# Patient Record
Sex: Female | Born: 1985 | Race: Black or African American | Hispanic: No | Marital: Single | Smoking: Current every day smoker
Health system: Southern US, Community
[De-identification: ages and names within clinical notes are randomized; demographics above are authoritative.]

## PROBLEM LIST (undated history)

## (undated) DIAGNOSIS — D649 Anemia, unspecified: Secondary | ICD-10-CM

## (undated) DIAGNOSIS — T7840XA Allergy, unspecified, initial encounter: Secondary | ICD-10-CM

## (undated) DIAGNOSIS — K5090 Crohn's disease, unspecified, without complications: Secondary | ICD-10-CM

## (undated) DIAGNOSIS — K529 Noninfective gastroenteritis and colitis, unspecified: Secondary | ICD-10-CM

## (undated) DIAGNOSIS — L409 Psoriasis, unspecified: Secondary | ICD-10-CM

## (undated) DIAGNOSIS — N189 Chronic kidney disease, unspecified: Secondary | ICD-10-CM

## (undated) DIAGNOSIS — F4321 Adjustment disorder with depressed mood: Secondary | ICD-10-CM

## (undated) DIAGNOSIS — E440 Moderate protein-calorie malnutrition: Secondary | ICD-10-CM

## (undated) HISTORY — DX: Adjustment disorder with depressed mood: F43.21

## (undated) HISTORY — DX: Noninfective gastroenteritis and colitis, unspecified: K52.9

## (undated) HISTORY — PX: HERNIA REPAIR: SHX51

## (undated) HISTORY — DX: Anemia, unspecified: D64.9

## (undated) HISTORY — DX: Psoriasis, unspecified: L40.9

## (undated) HISTORY — DX: Allergy, unspecified, initial encounter: T78.40XA

## (undated) HISTORY — DX: Crohn's disease, unspecified, without complications: K50.90

## (undated) HISTORY — PX: TONSILLECTOMY: SHX5217

## (undated) HISTORY — DX: Moderate protein-calorie malnutrition: E44.0

## (undated) HISTORY — DX: Chronic kidney disease, unspecified: N18.9

---
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
## 2003-07-28 ENCOUNTER — Emergency Department (HOSPITAL_COMMUNITY): Admission: AD | Admit: 2003-07-28 | Discharge: 2003-07-28 | Payer: Self-pay | Admitting: Family Medicine

## 2005-11-19 ENCOUNTER — Other Ambulatory Visit: Admission: RE | Admit: 2005-11-19 | Discharge: 2005-11-19 | Payer: Self-pay | Admitting: *Deleted

## 2007-09-28 ENCOUNTER — Other Ambulatory Visit: Admission: RE | Admit: 2007-09-28 | Discharge: 2007-09-28 | Payer: Self-pay | Admitting: Gynecology

## 2008-03-19 ENCOUNTER — Other Ambulatory Visit: Admission: RE | Admit: 2008-03-19 | Discharge: 2008-03-19 | Payer: Self-pay | Admitting: Gynecology

## 2009-02-05 ENCOUNTER — Ambulatory Visit (HOSPITAL_COMMUNITY): Admission: RE | Admit: 2009-02-05 | Discharge: 2009-02-05 | Payer: Self-pay | Admitting: Urology

## 2009-10-09 IMAGING — NM NM [facility]
2 series · 7 of 7 positions shown · non-contrast
Comparison: None.

CLINICAL DATA: Microscopic hematuria.  Bilateral hydronephrosis.

NUCLEAR MEDICINE VOIDING CYSTOURETHROGRAM 02/05/2009:
TECHNIQUE: After catheterization of the bladder by standard
aseptic technique and drainage of residual urine, infusion of
radiopharmaceutical diluted in sterile saline was begun. Sequential
images were obtained during bladder filling and voiding.
Radiopharmaceutical: 1.0 mCi technetium 99m pertechnetate in 520 ml
of sterile saline.

[st static image · 1 of 1 slices shown]
[im 1/1  full-range]
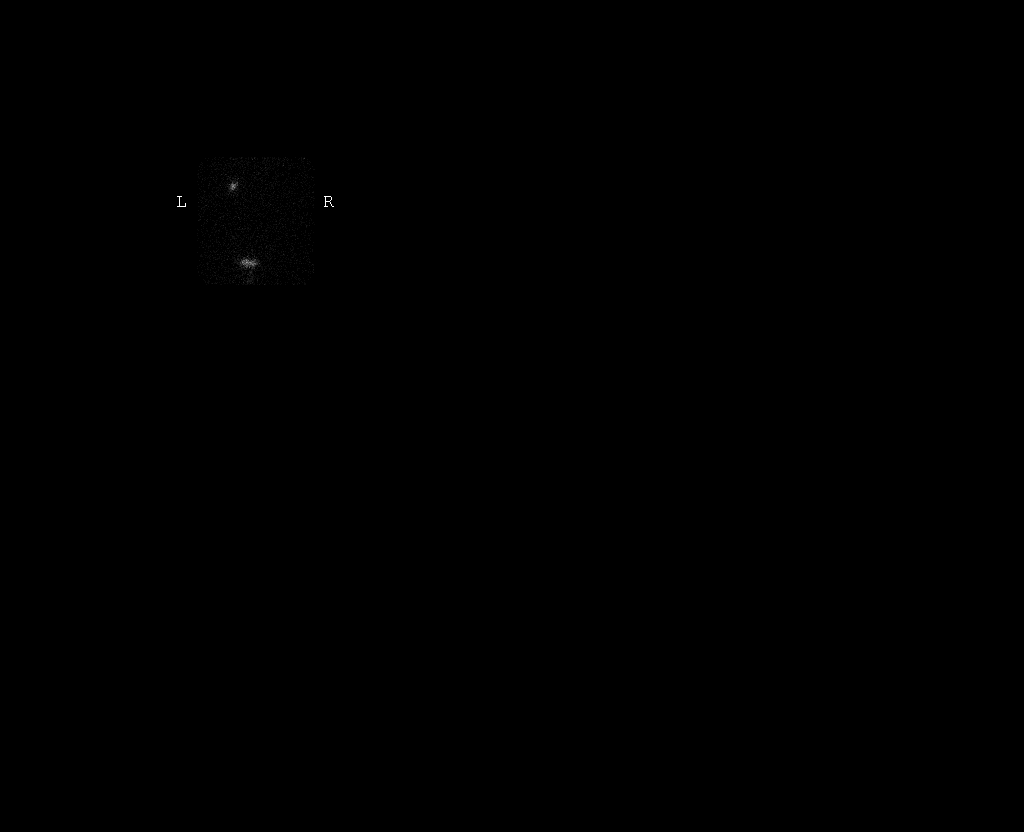

[dy vcug · 10.03mm/px · 6 of 29 frames shown]
[frame 3/29]
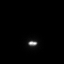
[frame 7/29]
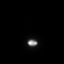
[frame 12/29]
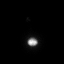
[frame 17/29]
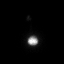
[frame 22/29]
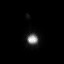
[frame 27/29]
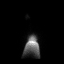

[7 of 7 positions shown; findings below may reference images not displayed]

FINDINGS: Vesicoureteral reflux on the left occurred during bladder
filling, with apparent moderate distention of the renal collecting
system.  No vesicoureteral reflux was identified on the right.
Reflux persisted during voiding.  The patient was able to
completely empty the bladder with voiding.  On the post-void image,
the left hydronephrosis appears to have improved.
IMPRESSION: Grade 3 vesicoureteral reflux on the left.

## 2011-04-07 ENCOUNTER — Encounter: Payer: Self-pay | Admitting: Internal Medicine

## 2011-04-21 ENCOUNTER — Encounter: Payer: Self-pay | Admitting: Internal Medicine

## 2011-04-27 ENCOUNTER — Other Ambulatory Visit: Payer: Self-pay | Admitting: Internal Medicine

## 2011-04-27 ENCOUNTER — Ambulatory Visit (INDEPENDENT_AMBULATORY_CARE_PROVIDER_SITE_OTHER): Payer: Private Health Insurance - Indemnity | Admitting: Internal Medicine

## 2011-04-27 ENCOUNTER — Other Ambulatory Visit (INDEPENDENT_AMBULATORY_CARE_PROVIDER_SITE_OTHER): Payer: Private Health Insurance - Indemnity

## 2011-04-27 ENCOUNTER — Encounter: Payer: Self-pay | Admitting: Internal Medicine

## 2011-04-27 VITALS — BP 104/72 | HR 72 | Ht 62.0 in | Wt 109.6 lb

## 2011-04-27 DIAGNOSIS — K5080 Crohn's disease of both small and large intestine without complications: Secondary | ICD-10-CM | POA: Insufficient documentation

## 2011-04-27 DIAGNOSIS — R197 Diarrhea, unspecified: Secondary | ICD-10-CM

## 2011-04-27 DIAGNOSIS — R1030 Lower abdominal pain, unspecified: Secondary | ICD-10-CM

## 2011-04-27 DIAGNOSIS — R634 Abnormal weight loss: Secondary | ICD-10-CM

## 2011-04-27 DIAGNOSIS — R630 Anorexia: Secondary | ICD-10-CM

## 2011-04-27 DIAGNOSIS — R109 Unspecified abdominal pain: Secondary | ICD-10-CM

## 2011-04-27 DIAGNOSIS — K529 Noninfective gastroenteritis and colitis, unspecified: Secondary | ICD-10-CM

## 2011-04-27 LAB — COMPREHENSIVE METABOLIC PANEL
ALT: 8 U/L (ref 0–35)
AST: 14 U/L (ref 0–37)
Albumin: 3.9 g/dL (ref 3.5–5.2)
Alkaline Phosphatase: 69 U/L (ref 39–117)
BUN: 8 mg/dL (ref 6–23)
Chloride: 107 mEq/L (ref 96–112)
Potassium: 4.2 mEq/L (ref 3.5–5.1)

## 2011-04-27 LAB — CBC WITH DIFFERENTIAL/PLATELET
Basophils Relative: 0.2 % (ref 0.0–3.0)
Hemoglobin: 13.3 g/dL (ref 12.0–15.0)
Lymphs Abs: 1.3 10*3/uL (ref 0.7–4.0)
MCHC: 33.4 g/dL (ref 30.0–36.0)
MCV: 81.1 fl (ref 78.0–100.0)
Neutro Abs: 5 10*3/uL (ref 1.4–7.7)
RBC: 4.9 Mil/uL (ref 3.87–5.11)
RDW: 16.2 % — ABNORMAL HIGH (ref 11.5–14.6)

## 2011-04-27 LAB — TSH: TSH: 0.53 u[IU]/mL (ref 0.35–5.50)

## 2011-04-27 MED ORDER — HYOSCYAMINE SULFATE 0.125 MG SL SUBL: 0.1250 mg | SUBLINGUAL_TABLET | SUBLINGUAL | Status: DC | PRN

## 2011-04-27 MED ORDER — ALIGN 4 MG PO CAPS: 1.0000 | ORAL_CAPSULE | Freq: Every day | ORAL | Status: DC

## 2011-04-27 MED ORDER — PEG-KCL-NACL-NASULF-NA ASC-C 100 G PO SOLR: 1.0000 | Freq: Once | ORAL | Status: DC

## 2011-04-27 MED ORDER — DIPHENOXYLATE-ATROPINE 2.5-0.025 MG PO TABS: 1.0000 | ORAL_TABLET | Freq: Four times a day (QID) | ORAL | Status: AC | PRN

## 2011-04-27 NOTE — Progress Notes (Signed)
Subjective:    Patient ID: Natacia Chaisson, female    DOB: February 22, 1986, 25 y.o.   MRN: [id number]  HPI Ms. Lupi is a 25 year old female with little past medical history who is referred by Dr. Darcus Austin for evaluation of abdominal pain and diarrhea. The patient reports her symptoms started in late July/early August 2012 when she returned from a trip to the [place]  She reports developing diarrhea and mid to lower abdominal pain which has persisted. She reports early in her symptoms, around August 2012, she was seen by her primary care provider for her issues. She reports stool studies were performed which were negative and she was treated empirically with metronidazole x7 days. She reports this helped relieve the pain initially, but her symptoms quickly returned. She reports now ongoing diarrhea, occurring 7-8 times per day. This also occurs at night and makes sleep difficult for her. She denies bloody or melenic stools. She does report that her should stools are "shiny" and with mucus.  She reports associated mid to lower abdominal pain, which she reports as cramping. This cramping type pain can be severe and is usually improved with bowel movement. She reports it can be so severe that she vomits. She denies hematemesis. She also reports some postprandial nausea, and notes that her stools are often loose after eating. She also reports some anorexia with an approximate 15 pound weight loss. She denies fever or chills. Prior to her trip over the summer, she reports she is having one to 2 formed stools daily. Diarrhea had never been a problem for her in the past. No heartburn or swallowing difficulty. No rashes or joint pains. No eye symptoms. No oral ulcers.  Review of Systems Constitutional: Negative for fever, chills, night sweats, activity change, appetite change and unexpected weight change HEENT: Negative for sore throat, mouth sores and trouble swallowing. Eyes: Negative for visual  disturbance Respiratory: Negative for cough, chest tightness and shortness of breath Cardiovascular: Negative for chest pain, palpitations and lower extremity swelling Gastrointestinal: See history of present illness Genitourinary: Negative for dysuria and hematuria. Musculoskeletal: Negative for back pain, arthralgias and myalgias Skin: Negative for rash or color change Neurological: Negative for headaches, weakness, numbness Hematological: Negative for adenopathy, negative for easy bruising/bleeding Psychiatric/behavioral: Negative for depressed mood, negative for anxiety  Past Medical History  Diagnosis Date  . Asthma    Current Outpatient Prescriptions  Medication Sig Dispense Refill  . albuterol (PROVENTIL) (2.5 MG/3ML) 0.083% nebulizer solution Take 2.5 mg by nebulization every 6 (six) hours as needed.        . medroxyPROGESTERone (DEPO-PROVERA) 150 MG/ML injection Inject 150 mg into the muscle every 3 (three) months.         Allergies  Allergen Reactions  . Novocain Swelling   Family History  Problem Relation Age of Onset  . Diabetes Maternal Grandmother   . Diabetes Paternal Grandmother   . Heart disease Mother     Social History  . Marital Status: Single   Occupational History  . Analyst    Social History Main Topics  . Smoking status: Never Smoker   . Smokeless tobacco: Never Used  . Alcohol Use: Very, very rarely.  . Drug Use: No      Objective:   Physical Exam BP 104/72  Pulse 72  Ht 5' 2"  (1.575 m)  Wt 109 lb 9.6 oz (49.714 kg)  BMI 20.05 kg/m2 Constitutional: Well-developed and well-nourished. No distress. HEENT: Normocephalic and atraumatic. Oropharynx is clear and moist.  No oropharyngeal exudate. Conjunctivae are normal. Pupils are equal round and reactive to light. No scleral icterus. Neck: Neck supple. Trachea midline. Cardiovascular: Normal rate, regular rhythm and intact distal pulses. No M/R/G Pulmonary/chest: Effort normal and breath sounds  normal. No wheezing, rales or rhonchi. Abdominal: Soft, nontender, nondistended. Bowel sounds active throughout. There are no masses palpable. No hepatosplenomegaly. Extremities: no clubbing, cyanosis, or edema Lymphadenopathy: No cervical adenopathy noted. Neurological: Alert and oriented to person place and time. Skin: Skin is warm and dry. No rashes noted. Psychiatric: Normal mood and affect. Behavior is normal.    Assessment & Plan:  25 yo female with little PMH presenting with approximately 5 months of diarrhea, abdominal pain, with mild anorexia and weight loss.  1. Diarrhea/abd pain -- given the close association of the patient's symptoms with her trip to the [place], the possibility of an infectious etiology is raised.  She reports having had negative stool studies, but I would like to repeat these today. This will include stool culture, C. difficile testing, fecal leukocytes, and ova and parasite exam. I will add on testing for Entamoeba histolytica and Strongyloides as these are possible, but would be missed on a basic ova and parasite screen. Also will check a spot fecal fat. If these studies are negative, we will proceed with colonoscopy to aid in diagnosis. If the colon is endoscopically normal, random biopsies will be performed to exclude microscopic inflammation/colitis. Lab work be performed today CBC, CMP, celiac panel, TSH. I will give her prescription for Levsin to be used as needed for spasm. Also will give her Lomotil to use as needed for diarrhea, assuming the stool studies are negative. Finally I have recommended Align one tablet daily as a probiotic.

## 2011-04-27 NOTE — Patient Instructions (Signed)
You have been scheduled for a colonoscopy. Please follow written instructions given to you at your visit today.  Please pick up your prep kit at the pharmacy within the next 2-3 days.  We have sent the following medications to your pharmacy for you to pick up at your convenience: Moviprep, levsin and Lomotil  Your physician has requested that you go to the basement for lab work before leaving today:  You have been given samples of Align today. Take 1 tablet daily.

## 2011-04-28 ENCOUNTER — Ambulatory Visit: Payer: Private Health Insurance - Indemnity

## 2011-04-28 DIAGNOSIS — R1030 Lower abdominal pain, unspecified: Secondary | ICD-10-CM

## 2011-04-28 DIAGNOSIS — R634 Abnormal weight loss: Secondary | ICD-10-CM

## 2011-04-28 DIAGNOSIS — R630 Anorexia: Secondary | ICD-10-CM

## 2011-04-28 DIAGNOSIS — R197 Diarrhea, unspecified: Secondary | ICD-10-CM

## 2011-04-28 DIAGNOSIS — K529 Noninfective gastroenteritis and colitis, unspecified: Secondary | ICD-10-CM

## 2011-04-28 LAB — TISSUE TRANSGLUTAMINASE, IGA: Tissue Transglutaminase Ab, IgA: 5.1 U/mL (ref <20)

## 2011-04-29 LAB — OVA AND PARASITE SCREEN: OP: NONE SEEN

## 2011-05-01 ENCOUNTER — Telehealth: Payer: Self-pay | Admitting: Internal Medicine

## 2011-05-01 LAB — E. HISTOLYTICA ANTIBODY (AMOEBA AB)

## 2011-05-01 NOTE — Telephone Encounter (Signed)
Patient advised we will call her with the results once they are all back

## 2011-05-07 ENCOUNTER — Telehealth: Payer: Self-pay | Admitting: Internal Medicine

## 2011-05-07 NOTE — Telephone Encounter (Signed)
Informed pt per Dr Hilarie Fredrickson, she needs to begin her prep for her COLON scheduled for in the am; pt stated understanding.

## 2011-05-07 NOTE — Telephone Encounter (Signed)
Pt is scheduled for COLON tomorrow and she was told depending on the lab results, not to buy her [place]. Dr Hilarie Fredrickson can you please review her labs? Thanks.

## 2011-05-08 ENCOUNTER — Ambulatory Visit (AMBULATORY_SURGERY_CENTER): Payer: Private Health Insurance - Indemnity | Admitting: Internal Medicine

## 2011-05-08 ENCOUNTER — Encounter: Payer: Self-pay | Admitting: Internal Medicine

## 2011-05-08 ENCOUNTER — Encounter: Payer: Self-pay | Admitting: *Deleted

## 2011-05-08 ENCOUNTER — Other Ambulatory Visit: Payer: Self-pay | Admitting: Internal Medicine

## 2011-05-08 DIAGNOSIS — R634 Abnormal weight loss: Secondary | ICD-10-CM

## 2011-05-08 DIAGNOSIS — K5180 Other ulcerative colitis without complications: Secondary | ICD-10-CM

## 2011-05-08 DIAGNOSIS — R197 Diarrhea, unspecified: Secondary | ICD-10-CM

## 2011-05-08 DIAGNOSIS — R109 Unspecified abdominal pain: Secondary | ICD-10-CM

## 2011-05-08 MED ORDER — PREDNISONE (PAK) 10 MG PO TABS: 40.0000 mg | ORAL_TABLET | Freq: Every day | ORAL | Status: AC

## 2011-05-08 MED ORDER — SODIUM CHLORIDE 0.9 % IV SOLN: 500.0000 mL | INTRAVENOUS | Status: DC

## 2011-05-08 NOTE — Progress Notes (Signed)
Patient did not experience any of the following events: a burn prior to discharge; a fall within the facility; wrong site/side/patient/procedure/implant event; or a hospital transfer or hospital admission upon discharge from the facility. (G8907) Patient did not have preoperative order for IV antibiotic SSI prophylaxis. (G8918)  

## 2011-05-08 NOTE — Patient Instructions (Signed)
Start Prednisone and await biopsy results. You will be contacted by staff on 3rd floor for your follow-up appointment. Absolutely NO NSAIDS (aspirin, ibuprofen, Aleve, etc.) Please refer to blue and green discharge instruction sheets.

## 2011-05-08 NOTE — Op Note (Signed)
[facility] [address] [place], [place]  [id number]  COLONOSCOPY PROCEDURE REPORT  PATIENT:  Kassia, Demarinis  MR#:  [id number] BIRTHDATE:  Jul 28, 1985, 25 yrs. old  GENDER:  female ENDOSCOPIST:  Lajuan Lines. Boluwatife Mutchler, MD REF. BY:  Darcus Austin, M.D. PROCEDURE DATE:  05/08/2011 PROCEDURE:  Colonoscopy with biopsy ASA CLASS:  Class I INDICATIONS:  unexplained diarrhea MEDICATIONS:   These medications were titrated to patient response per physician's verbal order, Benadryl 25 mg IV, Fentanyl 100 mcg IV, Versed 10 mg IV  DESCRIPTION OF PROCEDURE:   After the risks benefits and alternatives of the procedure were thoroughly explained, informed consent was obtained.  Digital rectal exam was performed and revealed tender.   The LB [id number] endoscope was introduced through the anus and advanced to the terminal ileum which was intubated for a short distance, without limitations. The quality of the prep was Moviprep fair.  The instrument was then slowly withdrawn as the colon was fully examined. <<PROCEDUREIMAGES>>  FINDINGS:  There were inflammatory changes in the ileum characterized by erythema and shallow, aphthous-like ulceration. Multiple biopsies were obtained and sent to pathology.  Patchy colitis was found throughout the colon, most marked in the distal ascending, transverse and proximal descending colon.  There were areas of deep ulceration surrounded by areas of rather normal appearing mucosa.  The rectum was relatively spared.  Multiple biopsies were obtained and sent to pathology.   Retroflexed views in the rectum revealed no abnormalities.   The scope was then withdrawn from the cecum and the procedure completed.  COMPLICATIONS:  None  ENDOSCOPIC IMPRESSION: 1) Ileitis in the terminal ileum.  Multiple biopsies performed.  2) Colitis throughout the colon in patchy distribution, suspicious for inflammatory bowel disease (Crohn's)..  Multiple biopsies  performed.  RECOMMENDATIONS: 1) Avoid all NSAIDS (ibuprofen, naproxen) 2) Await pathology results 3) Begin prednisone 40 mg daily.  Will discuss tapering dose once pathology results are available. 4) Follow-up appointment with GI Clinic 2 - 3 weeks.  You will be contacted with this appointment time. 5) You will receive a letter within 1-2 weeks with the results of your biopsy as well as final recommendations. Please call my office if you have not received a letter after 3 weeks.  Lajuan Lines. Hilarie Fredrickson, MD  CC:  Darcus Austin, MD The Patient  n. eSIGNED:   Lajuan Lines. Dorreen Valiente at 05/08/2011 09:12 AM  Leonia Reader, [phone]

## 2011-05-11 ENCOUNTER — Telehealth: Payer: Self-pay | Admitting: *Deleted

## 2011-05-11 NOTE — Telephone Encounter (Signed)
Follow up Call- Patient questions:  Do you have a fever, pain , or abdominal swelling? no Pain Score  0 *  Have you tolerated food without any problems? yes  Have you been able to return to your normal activities? yes  Do you have any questions about your discharge instructions: Diet   no Medications  yes Follow up visit  no  Do you have questions or concerns about your Care? no  Actions: * If pain score is 4 or above: No action needed, pain <4. Pt states she has been taking the "cramping " medicine once a day since procedure. Has 5 tablets left and questioned does she need refill or will the prednisone take care of her problem. Instructed pt we did biopsies and will have a more definitive answer for her once they are back but can continue med for cramping as directed as well as the prednisone and call for a refill if she feels like she needs it. Instructed pt she also needs to call and schedule an appt with dr pyrtle in 2-3 weeks to discuss findings further. Pt returned verbal understanding of all instructions discussed. ewm

## 2011-05-14 ENCOUNTER — Telehealth: Payer: Self-pay | Admitting: *Deleted

## 2011-05-14 MED ORDER — PREDNISONE 5 MG PO TABS
ORAL_TABLET | ORAL | Status: DC
Start: 2011-05-14 — End: 2012-01-18

## 2011-05-14 NOTE — Telephone Encounter (Signed)
Notified pt that Dr Hilarie Fredrickson stated she needs to be on a tapering dose of prednisone for her Crohn's. She should have been on 50m daily for 7 days and on Saturday, 05/16/11, she should decrease to 394mdaily x 7days then decrease by 12m28mor 7 days until off. Pt will need a f/u with Dr PyrHilarie Fredrickson 4 weeks; appt 06/12/11 at 08:30am. Pt stated understanding and will use her 62m47mbs with the 12mg 52ms until used up.

## 2011-05-15 ENCOUNTER — Encounter: Payer: Self-pay | Admitting: Internal Medicine

## 2011-06-11 ENCOUNTER — Encounter: Payer: Self-pay | Admitting: Internal Medicine

## 2011-06-12 ENCOUNTER — Ambulatory Visit (INDEPENDENT_AMBULATORY_CARE_PROVIDER_SITE_OTHER): Payer: Private Health Insurance - Indemnity | Admitting: Internal Medicine

## 2011-06-12 ENCOUNTER — Other Ambulatory Visit (INDEPENDENT_AMBULATORY_CARE_PROVIDER_SITE_OTHER): Payer: Private Health Insurance - Indemnity

## 2011-06-12 ENCOUNTER — Encounter: Payer: Self-pay | Admitting: Internal Medicine

## 2011-06-12 VITALS — BP 100/70 | HR 84 | Ht 62.0 in | Wt 112.6 lb

## 2011-06-12 DIAGNOSIS — K5090 Crohn's disease, unspecified, without complications: Secondary | ICD-10-CM

## 2011-06-12 DIAGNOSIS — K5080 Crohn's disease of both small and large intestine without complications: Secondary | ICD-10-CM

## 2011-06-12 LAB — CBC WITH DIFFERENTIAL/PLATELET
Basophils Absolute: 0 10*3/uL (ref 0.0–0.1)
Eosinophils Absolute: 0.2 10*3/uL (ref 0.0–0.7)
HCT: 40.1 % (ref 36.0–46.0)
Lymphs Abs: 1.9 10*3/uL (ref 0.7–4.0)
MCHC: 33.2 g/dL (ref 30.0–36.0)
MCV: 83.5 fl (ref 78.0–100.0)
Monocytes Absolute: 0.3 10*3/uL (ref 0.1–1.0)
Platelets: 222 10*3/uL (ref 150.0–400.0)
RDW: 18.9 % — ABNORMAL HIGH (ref 11.5–14.6)

## 2011-06-12 LAB — COMPREHENSIVE METABOLIC PANEL
ALT: 11 U/L (ref 0–35)
AST: 13 U/L (ref 0–37)
Alkaline Phosphatase: 55 U/L (ref 39–117)
Glucose, Bld: 77 mg/dL (ref 70–99)
Sodium: 140 mEq/L (ref 135–145)
Total Bilirubin: 0.6 mg/dL (ref 0.3–1.2)
Total Protein: 7.2 g/dL (ref 6.0–8.3)

## 2011-06-12 NOTE — Progress Notes (Signed)
Subjective:    Patient ID: Brittany Le, female    DOB: July 22, 1985, 26 y.o.   MRN: [id number]  HPI Brittany Le returns today in follow-up after recent diagnosis of ileocolonic Crohn's disease. She was started on prednisone 40 mg daily after her colonoscopy and she has tapered down to 20 mg daily. Today she reports she is doing very well. She's had no abdominal pain, no diarrhea, no bloody stools. She reports a good appetite, no nausea or vomiting. No fevers or chills. No rashes, joint pains, mouth ulcers, eye complaints.  Sleeping well. No melena. Bowel movements are once every other day at this point.  Review of Systems Constitutional: Negative for fever, chills, night sweats, activity change, appetite change and unexpected weight change HEENT: Negative for sore throat, mouth sores and trouble swallowing. Eyes: Negative for visual disturbance Respiratory: Negative for cough, chest tightness and shortness of breath Cardiovascular: Negative for chest pain, palpitations and lower extremity swelling Gastrointestinal: See history of present illness Genitourinary: Negative for dysuria and hematuria. Musculoskeletal: Negative for back pain, arthralgias and myalgias Skin: Negative for rash or color change Neurological: Negative for headaches, weakness, numbness Hematological: Negative for adenopathy, negative for easy bruising/bleeding Psychiatric/behavioral: Negative for depressed mood, negative for anxiety  PMH:  ileocolonic Crohn's -- diagnosed December 2012  Current Outpatient Prescriptions  Medication Sig Dispense Refill  . albuterol (PROVENTIL) (2.5 MG/3ML) 0.083% nebulizer solution Take 2.5 mg by nebulization every 6 (six) hours as needed.        . medroxyPROGESTERone (DEPO-PROVERA) 150 MG/ML injection Inject 150 mg into the muscle every 3 (three) months.        . predniSONE (DELTASONE) 5 MG tablet Beginning 05/16/11, take 68m Prednisone by mouth daily for 7 days. After 7 days, decrease  Prednisone by 544mor 3075mnd continue tapering every 7 days by 5mg32mtil tapered off .  100 tablet  1  .. Probiotic Product (ALIGN) 4 MG CAPS Take 1 tablet by mouth daily.  30 capsule  0   Allergies  Allergen Reactions  . Novocain Swelling   Family History  Problem Relation Age of Onset  . Diabetes Maternal Grandmother   . Diabetes Paternal Grandmother   . Heart disease Mother   . Colon cancer Neg Hx   . Hypertension      Both sides   Social History  . 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 Smoking status: Never Smoker   . Smokeless tobacco: Never Used  . Alcohol Use: Yes     occ.  . Drug Use: No   Social History Narrative   Drinks sweet tea , not qd      Objective:   Physical Exam BP 100/70  Pulse 84  Ht 5' 2"  (1.575 m)  Wt 112 lb 9.6 oz (51.075 kg)  BMI 20.59 kg/m2 Constitutional: Well-developed and well-nourished. No distress. HEENT: Normocephalic and atraumatic. Oropharynx is clear and moist. No oropharyngeal exudate. Conjunctivae are normal. Pupils are equal round and reactive to light. No scleral icterus. Neck: Neck supple. Trachea midline. Cardiovascular: Normal rate, regular rhythm and intact distal pulses. No M/R/G Pulmonary/chest: Effort normal and breath sounds normal. No wheezing, rales or rhonchi. Abdominal: Soft, nontender, nondistended. Bowel sounds active throughout. There are no masses palpable. No hepatosplenomegaly. Extremities: no clubbing, cyanosis, or edema Lymphadenopathy: No cervical adenopathy noted. Neurological: Alert and oriented to person place and time. Skin: Skin is warm and dry. No rashes  noted. Psychiatric: Normal mood and affect. Behavior is normal.     Assessment & Plan:  This is a 26 year old female with little past medical history with a recent diagnosis of ileocolonic Crohn's disease with excellent response and clinical remission now on prednisone 20 mg daily.  1.  Ileocolonic Crohn's disease -- she has responded very nicely to steroid therapy and this has seemed to induce clinical remission.  Her disease involved her terminal ileum and scattered throughout her colon. Biopsies revealed inflammation with granulomas consistent with Crohn's.  At this point I am very confident as to her diagnosis.  We have spent a great deal of time today discussing maintenance therapy options in Crohn's disease.  Now that she is better, she is very much interested in maintaining remission and not sliding back to how she was before diagnosis.  We discussed the option of completing steroids and not starting any additional medication, and basically waiting to see when the disease would resurface.  I do not recommend this option, and she very much agrees.  We discussed maintenance therapy including immunomodulators and biologics.  We also discussed the risks in great detail including the risk of infection (including reactivation of latent TB and underlying viral hepatitis), hepatotoxicity, leukopenia, pancreatitis, nausea, malignancy (specifically lymphoma), demyelinating disease, and even heart failure.  She is leaning towards biologic therapy, and I feel this is a reasonable option to help maintain remission in her Crohn's disease. She has reviewed the Crohn's and colitis [employer], I would like to take the weekend to weigh the options and discuss with family.  This is very reasonable.  Today I will check labs including CMP, CBC, hepatitis B and C. Serologies. I will plan PPD for Monday of next week  I would like to see her 4 weeks after starting biologic therapy.  She will continue to wean off prednisone, by decreasing 5 mg per week.

## 2011-06-12 NOTE — Patient Instructions (Signed)
Your physician has requested that you go to the basement for lab work before leaving today.  Continue tapering off your prednisone.  Dr. Hilarie Fredrickson would like to see you back in the office in 6 weeks for a follow up.

## 2011-06-13 LAB — HEPATITIS B CORE ANTIBODY, TOTAL: Hep B Core Total Ab: NEGATIVE

## 2011-06-13 LAB — HEPATITIS C ANTIBODY: HCV Ab: NEGATIVE

## 2011-06-15 ENCOUNTER — Telehealth: Payer: Self-pay | Admitting: *Deleted

## 2011-06-15 NOTE — Telephone Encounter (Signed)
Message copied by Lance Morin on Mon Jun 15, 2011  4:21 PM ------      Message from: Jerene Bears      Created: Mon Jun 15, 2011  8:33 AM       Labs look good.      Hep B immune by vaccine and Hep C is negative.  This means she would not be at risk for reactivation of viral hepatitis if/when she starts Humira or Remicade.      She will need the ppd still

## 2011-06-15 NOTE — Telephone Encounter (Signed)
Notified pt of Dr Vena Rua findings and recommendations and she will need a PPD test. Pt stated understanding and will have her test on Wednesday, 06/17/11 at 0800am.

## 2011-06-15 NOTE — Telephone Encounter (Signed)
Message copied by Lance Morin on Mon Jun 15, 2011  3:10 PM ------      Message from: Brittany Le      Created: Mon Jun 15, 2011  8:33 AM       Labs look good.      Hep B immune by vaccine and Hep C is negative.  This means she would not be at risk for reactivation of viral hepatitis if/when she starts Humira or Remicade.      She will need the ppd still

## 2011-06-17 ENCOUNTER — Ambulatory Visit (INDEPENDENT_AMBULATORY_CARE_PROVIDER_SITE_OTHER): Payer: Private Health Insurance - Indemnity | Admitting: Internal Medicine

## 2011-06-17 DIAGNOSIS — K5090 Crohn's disease, unspecified, without complications: Secondary | ICD-10-CM

## 2011-06-19 LAB — TB SKIN TEST: TB Skin Test: NEGATIVE mm

## 2011-06-23 ENCOUNTER — Telehealth: Payer: Self-pay | Admitting: *Deleted

## 2011-06-23 MED ORDER — ADALIMUMAB 40 MG/0.8ML ~~LOC~~ KIT: PACK | SUBCUTANEOUS | Status: DC

## 2011-06-23 MED ORDER — ADALIMUMAB 40 MG/0.8ML ~~LOC~~ KIT: 40.0000 mg | PACK | SUBCUTANEOUS | Status: DC

## 2011-06-23 NOTE — Telephone Encounter (Signed)
Message copied by Lance Morin on Tue Jun 23, 2011  3:30 PM ------      Message from: Jerene Bears      Created: Mon Jun 22, 2011  1:04 PM       PPD and hepatitis serologies negative      Can proceed with biologic therapy for Crohn's patient has made a decision regarding therapy      I need to see her one month to 6 weeks after starting

## 2011-06-23 NOTE — Telephone Encounter (Signed)
Notified pt I ordered her Humira and to call when she receives the injections so we can schedule her an appt to teach her how to inject; pt stated understanding.

## 2011-10-09 ENCOUNTER — Telehealth: Payer: Self-pay | Admitting: Internal Medicine

## 2011-10-09 NOTE — Telephone Encounter (Signed)
lmom for pt to call back

## 2011-10-13 NOTE — Telephone Encounter (Signed)
Pt never called back.

## 2011-12-02 ENCOUNTER — Telehealth: Payer: Self-pay | Admitting: *Deleted

## 2011-12-15 NOTE — Telephone Encounter (Signed)
Pt never called back and never had Humira filled per her pharmacy. Mailed pt a letter asking her to call us.

## 2012-01-12 ENCOUNTER — Telehealth: Payer: Self-pay | Admitting: *Deleted

## 2012-01-12 NOTE — Telephone Encounter (Signed)
Pt was to start Humira therapy and never got the script filled. We attempted several times to contact pt by phone and finally wrote a letter to her on 12/15/11; as of yet no response from her. Please advise. Thanks.

## 2012-01-12 NOTE — Telephone Encounter (Signed)
Return to sender for additional info

## 2012-01-12 NOTE — Telephone Encounter (Signed)
Message copied by Lance Morin on Tue Jan 12, 2012 10:27 AM ------      Message from: Lance Morin      Created: Tue Dec 15, 2011  2:12 PM       Ck to see if pt called or made an appt

## 2012-01-15 NOTE — Telephone Encounter (Signed)
Pt reports she is having flares at times with cramping and diarrhea that will last a few days and then clear up. She has noticed that this happens when she overeats. Pt reports she found out she has to go thru [place] for the Humira . She wants to discuss this with Dr Hilarie Fredrickson; she has an appt for 01/18/12.

## 2012-01-18 ENCOUNTER — Encounter: Payer: Self-pay | Admitting: Internal Medicine

## 2012-01-18 ENCOUNTER — Ambulatory Visit (INDEPENDENT_AMBULATORY_CARE_PROVIDER_SITE_OTHER): Payer: Private Health Insurance - Indemnity | Admitting: Internal Medicine

## 2012-01-18 VITALS — BP 98/60 | HR 88 | Ht 62.0 in | Wt 120.0 lb

## 2012-01-18 DIAGNOSIS — K5090 Crohn's disease, unspecified, without complications: Secondary | ICD-10-CM

## 2012-01-18 MED ORDER — BUDESONIDE 3 MG PO CP24: 9.0000 mg | ORAL_CAPSULE | ORAL | Status: DC

## 2012-01-18 MED ORDER — HYOSCYAMINE SULFATE 0.125 MG SL SUBL: 0.1250 mg | SUBLINGUAL_TABLET | SUBLINGUAL | Status: DC | PRN

## 2012-01-18 NOTE — Patient Instructions (Addendum)
We have sent the following medications to your pharmacy for you to pick up at your convenience: Entocort, Take 2m daily for 8 weeks,  Levsin  Follow up with Dr. PHilarie Fredricksonin 6 weeks

## 2012-01-18 NOTE — Progress Notes (Signed)
Patient ID: Brittany Le, female   DOB: 1985/09/02, 26 y.o.   MRN: [id number]  SUBJECTIVE: HPI  Brittany Le is a 26 year old female with ileocolonic Crohn's disease who is seen in followup. She is alone today. She was initially seen for abdominal pain and diarrhea and underwent colonoscopy in December 2012. This revealed terminal ileitis and colitis in patchy distribution with pathology revealing active and chronic inflammation with granulomas consistent with Crohn's disease. She was initially treated with a prednisone taper over 8 weeks and improved significantly. I saw her in clinic in February 2013 and we discussed immunomodulators therapy versus biologic therapy for maintenance of remission, and I was preparing her to begin Humira. She was subsequently lost to followup and returns today after 7 months. Day she reports she is doing better than when she was initially diagnosed, but over the last 4 or so weeks she's had return of "nagging abdominal discomfort" and diarrhea. She is having 5-6 stools a day which she reports as nonbloody. She also denies melena. Her appetite has been good. She's had no nausea or vomiting. No oral ulcers, joint pains, rashes, or eye complaints. 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 She reports that fruits in her diet seem to make her abdominal pain worse. She was trying hyoscyamine for abdominal cramping, which she reports helped, and she requests a refill.  She states being somewhat scared of Humira after her last meeting, and perhaps this was the reason for inaction.  She also voices concerns over her desire to start a family sometime in the next several years, and she may in fact get married soon.  Review of Systems  As per history of present illness, otherwise negative   Past Medical History  Diagnosis Date  . Asthma   . Allergy     SEASONAL  . Anemia   . Chronic kidney disease     KIDNEY REFLUX  . Crohn's disease     Current  Outpatient Prescriptions  Medication Sig Dispense Refill  . albuterol (PROVENTIL) (2.5 MG/3ML) 0.083% nebulizer solution Take 2.5 mg by nebulization every 6 (six) hours as needed.        . medroxyPROGESTERone (DEPO-PROVERA) 150 MG/ML injection Inject 150 mg into the muscle every 3 (three) months.        . budesonide (ENTOCORT EC) 3 MG 24 hr capsule Take 3 capsules (9 mg total) by mouth every morning.  90 capsule  3  . hyoscyamine (LEVSIN/SL) 0.125 MG SL tablet Place 1 tablet (0.125 mg total) under the tongue every 4 (four) hours as needed for cramping.  30 tablet  0  . hyoscyamine (LEVSIN/SL) 0.125 MG SL tablet Place 1 tablet (0.125 mg total) under the tongue every 4 (four) hours as needed for cramping.  30 tablet  0   Current Facility-Administered Medications  Medication Dose Route Frequency Provider Last Rate Last Dose  . DISCONTD: 0.9 %  sodium chloride infusion  500 mL Intravenous Continuous Jerene Bears, MD        Allergies  Allergen Reactions  . Procaine Hcl Swelling    Family History  Problem Relation Age of Onset  . Diabetes Maternal Grandmother   . Diabetes Paternal Grandmother   . Heart disease Mother   . Colon cancer Neg Hx   . Hypertension      Both sides    History  Substance Use Topics  . Smoking status: Never Smoker   . Smokeless tobacco: Never Used  . Alcohol  Use: Yes     occ.    OBJECTIVE: BP 98/60  Pulse 88  Ht 5' 2"  (1.575 m)  Wt 120 lb (54.432 kg)  BMI 21.95 kg/m2 Constitutional: Well-developed and well-nourished. No distress. HEENT: Normocephalic and atraumatic. Oropharynx is clear and moist. No oropharyngeal exudate. Conjunctivae are normal. No scleral icterus. Neck: Neck supple. Trachea midline. Cardiovascular: Normal rate, regular rhythm and intact distal pulses. No M/R/G Pulmonary/chest: Effort normal and breath sounds normal. No wheezing, rales or rhonchi. Abdominal: Soft, nontender, nondistended. Bowel sounds active throughout. There are no  masses palpable. No hepatosplenomegaly. Extremities: no clubbing, cyanosis, or edema Lymphadenopathy: No cervical adenopathy noted. Neurological: Alert and oriented to person place and time. Skin: Skin is warm and dry. No rashes noted. Psychiatric: Normal mood and affect. Behavior is normal.  ASSESSMENT AND PLAN: 26 year old female with ileocolonic Crohn's disease who is seen in followup  1. Ileocolonic Crohn's -- the patient was initially diagnosed in December 2012, and today is having symptoms of mild and active Crohn's disease. There is no evidence of stricturing disease or fistula arising disease.  We have again discussed options for therapy, particularly as it pertains to maintenance of remission. We have touched on immunomodulators therapy and Biologics.  We again discussed the risks in great detail including the risk of infection (including reactivation of latent TB and underlying viral hepatitis), hepatotoxicity, leukopenia, pancreatitis, nausea, malignancy (specifically lymphoma), demyelinating disease.  At this point, I do understand her reluctance, particularly given the fact that she was able to go 9 months with only a single steroid taper. We discussed how steroids cannot be used for maintenance of remission. She would like to think more about biologic therapy, but is leaning towards Humira. For now I will give her a course of budesonide 9 mg daily for 8 weeks. If she can get by using budesonide approximately once per year, with relatively normal intervening times, and this may be an option for the next several years. I have made it clear however, I feel she will need a long-term maintenance medication in the future, perhaps sooner than later. If she responds to budesonide, but it fails to maintain remission, then we will escalate therapy at that time. She understands this plan , is happy, and time was provided for questions and answers.  I will refill her hyoscyamine and to be used as needed  and as directed. A low residue diet may help until her disease is under better control.   I will see her back in 6 weeks near the end of her budesonide course, or sooner if needed.

## 2012-02-03 ENCOUNTER — Telehealth: Payer: Self-pay | Admitting: Internal Medicine

## 2012-02-03 DIAGNOSIS — K5090 Crohn's disease, unspecified, without complications: Secondary | ICD-10-CM

## 2012-02-04 ENCOUNTER — Telehealth: Payer: Self-pay | Admitting: *Deleted

## 2012-02-04 MED ORDER — ADALIMUMAB 40 MG/0.8ML ~~LOC~~ KIT: PACK | SUBCUTANEOUS | Status: DC

## 2012-02-04 MED ORDER — PREDNISONE 5 MG PO TABS: ORAL_TABLET | ORAL | Status: DC

## 2012-02-04 NOTE — Telephone Encounter (Signed)
Informed pt she is certified for 2 years of Humira per Levada Dy at [facility] [phone]. I will fax her orders to [phone]. Informed pt of pprior auth and that order will be sent. She needs to call us when she receives the Starter Kit so we can give instructions and watch her self inject. She will come by today for info on Humira and a "talking pen".

## 2012-02-04 NOTE — Telephone Encounter (Signed)
Pt in to get her Humira talking pen and drug card. She asked if she could have a few prednisone to help with her cramping; Entocort didn't work. Per Dr Hilarie Fredrickson, stop the Entocort and begin a tapering dose of Prednisone beginning at 84m x 7 days and decreasing by 5 mg each week until off. Pt stated understanding and will f/u on 03/01/12.

## 2012-02-09 ENCOUNTER — Telehealth: Payer: Self-pay | Admitting: Internal Medicine

## 2012-02-11 ENCOUNTER — Telehealth: Payer: Self-pay | Admitting: *Deleted

## 2012-02-11 NOTE — Telephone Encounter (Signed)
Pt called to report she still doesn't have her Humira and when she called about, they say they never got the order. Please refax to [phone]. Refaxed the order to number given I have call the ABBVIE rep and hopefully they can help.  Called [facility] and the order had been received, it had not left Customer Service. The pharmacy rep told me everything was in order and had me inform the pt to call [phone]. Done. The ABBVIE rep did call back and she will help if pt can't get her meds by tomorrow.

## 2012-02-11 NOTE — Telephone Encounter (Signed)
lmom for pt to call back; checking to see if [facility] is shipping her drug.

## 2012-02-12 ENCOUNTER — Telehealth: Payer: Self-pay | Admitting: Internal Medicine

## 2012-02-12 NOTE — Telephone Encounter (Signed)
Not needed

## 2012-02-12 NOTE — Telephone Encounter (Signed)
Pt called to report [facility] is supposed to ship her drug today; if she hasn't received it by 2pm she is to call. Pt will come by here for teaching on her 1st injections.

## 2012-02-12 NOTE — Telephone Encounter (Signed)
Pt in and we started her Humira induction today with 4 SQ injections. Pt at first thought she could give the 2nd shot, but the injections burn and she gor nervous; I gave all 4 injections. The sites were w/o bleeding or swelling. Pt was instructed to call the [place] number in her pkg for problems over the weekend. She was instructed to put the remaining 2 pens in the fridge until her next injection in 2 weeks or 02/26/12. She will call and come here and if still having problems, she will have the nurse for [facility] [facility] or [facility] inject.

## 2012-02-25 NOTE — Telephone Encounter (Signed)
Message answered

## 2012-02-26 ENCOUNTER — Telehealth: Payer: Self-pay | Admitting: *Deleted

## 2012-02-26 ENCOUNTER — Encounter: Payer: Self-pay | Admitting: Internal Medicine

## 2012-02-26 NOTE — Telephone Encounter (Signed)
Pt here for her Humira injections; week 2 of induction. Pt is still afraid and too nervous to self inject. Today, we put ice on the site prior to the injections and she stated that was better. She would like for a nurse to come do her injections. Called [facility] at [phone] to ask about scheduling a nurse visit. [facility] does not schedule the RN visits so I called [phone] and spoke with the [facility] RN who will fax me an order form for add'l injection training. Pt reports she is doing so much better. She stopped the Entocort when it didn't work, but remains on a Prednisone taper. Next Thursday she will decrease to 66m/day x 7 days and then to 552m Pt wants to know if she still needs a f/u; we r/s next week's OV to 03/10/12. She had hoped to get a Friday appt so we could give her injection due in 03/11/12, but Dr PyHilarie Fredricksons not in the ofc on that day. Does pt need to f/u in 2 weeks since she is doing well? Also, does she need to continue the Prednisone? Thanks.

## 2012-03-01 ENCOUNTER — Ambulatory Visit: Payer: Private Health Insurance - Indemnity | Admitting: Internal Medicine

## 2012-03-08 ENCOUNTER — Encounter: Payer: Self-pay | Admitting: Internal Medicine

## 2012-03-09 ENCOUNTER — Telehealth: Payer: Self-pay | Admitting: *Deleted

## 2012-03-09 NOTE — Telephone Encounter (Signed)
Ms Brittany Le, ?, called to report she cannot get in touch with the pt and wants to check to see if she has the correct number. She has [phone]. lmom for pt to call back. I assume this is the nurse who is supposed to teach pt to self inject Humira.

## 2012-03-10 ENCOUNTER — Ambulatory Visit: Payer: Private Health Insurance - Indemnity | Admitting: Internal Medicine

## 2012-03-10 ENCOUNTER — Ambulatory Visit (INDEPENDENT_AMBULATORY_CARE_PROVIDER_SITE_OTHER): Payer: Private Health Insurance - Indemnity | Admitting: Internal Medicine

## 2012-03-10 ENCOUNTER — Encounter: Payer: Self-pay | Admitting: Internal Medicine

## 2012-03-10 VITALS — BP 110/70 | HR 82 | Ht 62.75 in | Wt 113.6 lb

## 2012-03-10 DIAGNOSIS — K5090 Crohn's disease, unspecified, without complications: Secondary | ICD-10-CM

## 2012-03-10 MED ORDER — HYOSCYAMINE SULFATE 0.125 MG SL SUBL: 0.1250 mg | SUBLINGUAL_TABLET | SUBLINGUAL | Status: DC | PRN

## 2012-03-10 NOTE — Patient Instructions (Addendum)
Please get your Flu hot next week at your GYN appointment.  Follow up with Dr. Hilarie Fredrickson in early February.  We have sent the following medications to your pharmacy for you to pick up at your convenience: Levsin

## 2012-03-10 NOTE — Telephone Encounter (Signed)
Received a call from the nurse who will teach self injections; [facility] has contacted the nurse for teaching. Pt here today for f/u visit. She was laid off from her job and may need assistance with Humira. Pt was given instructions today on applying for ABB[facility]IE assistance.

## 2012-03-10 NOTE — Progress Notes (Signed)
Patient ID: Sabria Florido, female   DOB: 1985/05/23, 26 y.o.   MRN: [id number]  SUBJECTIVE: HPI Ms. Hollar is a 26 year old female with ileocolonic Crohn's disease who is seen in followup. She started Humira after last visit.  She started about 1 month ago. She took the 160 mg dose around 02/12/12 then 80 mg 2 weeks later.  She is due for a single 40 mg dose tomorrow.  She is having home health come and help with injection.  She is feeling "great".  No abd pain.  BMs are now normal with no loose stools or diarrhea.  No rectal bleeding or melena.  No n/v.  Appetite is good.  No fevers, chills.  Still gets abd cramping when she eats foods like meats, but tolerates salads and other vegetables.    She has weaned down to prednisone 5 mg/day and has 5 total days left on this med.  Budesonide has been off when it didn't help and she was changed to prednisone taper over 7 weeks.  Unfortunately she recently lost her job with [employer] after 5 yrs in their [place].  She has applied to [employer] and even considered returning for her master's degree.  Review of Systems  As per history of present illness, otherwise negative   Past Medical History  Diagnosis Date  . Asthma   . Allergy     SEASONAL  . Anemia   . Chronic kidney disease     KIDNEY REFLUX  . Crohn's disease     Current Outpatient Prescriptions  Medication Sig Dispense Refill  . adalimumab (HUMIRA PEN STARTER) 40 MG/0.8ML injection Please provide 1 Humira Starter Kit containing 6 pre filled syringes of Humira 77m/0.8ml. Week 0 pt will inject 4 syringes into SQ tissue. Repeat in 2 weeks with 2 syringes into SQ tissue.  6 each  0  . adalimumab (HUMIRA) 40 MG/0.8ML injection Humira Maintenance pack containing 2 pre filled syringes of Humira 484m0.8ml. One injection of Humira 4058m.8ml every 2 weeks  to be given into SQ tissue.  2 each  11  . albuterol (PROVENTIL) (2.5 MG/3ML) 0.083% nebulizer solution Take 2.5 mg by  nebulization every 6 (six) hours as needed.        . medroxyPROGESTERone (DEPO-PROVERA) 150 MG/ML injection Inject 150 mg into the muscle every 3 (three) months.        . predniSONE (DELTASONE) 5 MG tablet Take 30 mg by mouth daily for 7 days then decrease by 5 mg or 25 mg for 7 days then 20 mg etc. until tapered off  147 tablet  0  . hyoscyamine (LEVSIN/SL) 0.125 MG SL tablet Place 1 tablet (0.125 mg total) under the tongue every 4 (four) hours as needed for cramping.  30 tablet  0  . DISCONTD: hyoscyamine (LEVSIN/SL) 0.125 MG SL tablet Place 1 tablet (0.125 mg total) under the tongue every 4 (four) hours as needed for cramping.  30 tablet  0  . DISCONTD: hyoscyamine (LEVSIN/SL) 0.125 MG SL tablet Place 1 tablet (0.125 mg total) under the tongue every 4 (four) hours as needed for cramping.  30 tablet  0    Allergies  Allergen Reactions  . Procaine Hcl Swelling    Family History  Problem Relation Age of Onset  . Diabetes Maternal Grandmother   . Diabetes Paternal Grandmother   . Heart disease Mother   . Colon cancer Neg Hx   . Hypertension      Both sides    History  Substance Use Topics  . Smoking status: Never Smoker   . Smokeless tobacco: Never Used  . Alcohol Use: Yes     occ.    OBJECTIVE: BP 110/70  Pulse 82  Ht 5' 2.75" (1.594 m)  Wt 113 lb 9.6 oz (51.529 kg)  BMI 20.28 kg/m2 Constitutional: Well-developed and well-nourished. No distress. HEENT: Normocephalic and atraumatic. Oropharynx is clear and moist. No oropharyngeal exudate. Conjunctivae are normal. No scleral icterus. Cardiovascular: Normal rate, regular rhythm and intact distal pulses. No M/R/G Pulmonary/chest: Effort normal and breath sounds normal. No wheezing, rales or rhonchi. Abdominal: Soft, nontender, nondistended. Bowel sounds active throughout. There are no masses palpable. No hepatosplenomegaly. Extremities: no clubbing, cyanosis, or edema Neurological: Alert and oriented to person place and  time. Skin: Skin is warm and dry. No rashes noted. Psychiatric: Normal mood and affect. Behavior is normal.  ASSESSMENT AND PLAN: 26 year old female with ileocolonic Crohn's disease who is seen in followup  1.  Ileocolonic Crohn's -- doing very today after starting Humira.  Has almost entirely weaned off steroids.  For now will continue with Humira 40 mg every 2 weeks.  Hopefully this will be sufficient to maintain remission for her.  She is reminded to let us know should she develop return of abd pain, n/v, diarrhea, BRBPR, fevers, chills.  I recommended flu vaccine and she will get this at her GYN visit next week.    Follow-up in 3 months or sooner if needed.

## 2012-03-21 ENCOUNTER — Telehealth: Payer: Self-pay | Admitting: Internal Medicine

## 2012-03-22 NOTE — Telephone Encounter (Signed)
Pt came in on 03/10/12 and she scheduled the appt with the nurse.

## 2012-03-24 NOTE — Telephone Encounter (Signed)
Noted  

## 2012-04-12 ENCOUNTER — Telehealth: Payer: Self-pay | Admitting: Internal Medicine

## 2012-04-12 NOTE — Telephone Encounter (Signed)
Pt started Humira induction on 02/12/12 and her next maintenance injection is due 12/05/14/11. Pt reports she started out with a acne like rash on her body and then it developed into blisters that burst on her hands. Pt saw Dr Wilhemina Bonito who stated this is a severe reaction and a type of psoriasis and prescribed a  Prednisone taper. Pt states she is not to take any more Humira per Dr Ronnald Ramp. Informed pt Dr Hilarie Fredrickson is off this week , but I will call her back. Spoke with Leafy Ro at [facility] who stated there is info in the pt pack to report any new rash or redness. Humira can cause new psoriasis outbreaks or worsening of existing psoriasis even though it is used as a tx for psoriasis; condition is called Pustular or Plantar/Palmer Psoriasis. ABBVIE does not offer suggestions for tx, OK for pt to start the Prednisone Dr Henrene Pastor, supv doc? Thanks.

## 2012-04-12 NOTE — Telephone Encounter (Signed)
Dr Hilarie Fredrickson, do we schedule pt for Remicade? Please advise. Thanks.

## 2012-04-12 NOTE — Telephone Encounter (Signed)
This is true with regards to the drug and possible skin reactions. Yes, prednisone is OK if needed for the rash. Please forward this information to Dr. Hilarie Fredrickson regarding long-term management of this patient's IBD. Thanks

## 2012-04-19 NOTE — Telephone Encounter (Signed)
I need to see the patient in clinic before proceeding with another biologic therapy. She should continue her prednisone taper Discontinue Humira and add to allergy list

## 2012-04-19 NOTE — Telephone Encounter (Signed)
Yes please have her followup in January 2014

## 2012-04-19 NOTE — Telephone Encounter (Signed)
Called to check on pt's condition and she states she's doing better. I was going to schedule her appt with Dr Hilarie Fredrickson and she states she has no insurance until January. She states she lost her insurance d/t her job, but she will go on her boyfriend's insurance as a domestic partner in January. She reports she completed the prednisone and has no signs or symptoms of her Crohn's. OK to wait to be seen in 05/2012? Thanks.

## 2012-04-20 NOTE — Telephone Encounter (Signed)
Informed pt Dr Brittany Le states she can wait til January, 2014 to be seen so we scheduled her for 05/14/12 at 10am; pt stated understanding.

## 2012-04-20 NOTE — Telephone Encounter (Signed)
lmom for pt to call back

## 2012-05-09 ENCOUNTER — Encounter: Payer: Self-pay | Admitting: Internal Medicine

## 2012-05-13 ENCOUNTER — Ambulatory Visit (INDEPENDENT_AMBULATORY_CARE_PROVIDER_SITE_OTHER): Payer: BC Managed Care – PPO | Admitting: Internal Medicine

## 2012-05-13 ENCOUNTER — Other Ambulatory Visit (INDEPENDENT_AMBULATORY_CARE_PROVIDER_SITE_OTHER): Payer: BC Managed Care – PPO

## 2012-05-13 ENCOUNTER — Encounter: Payer: Self-pay | Admitting: Internal Medicine

## 2012-05-13 ENCOUNTER — Telehealth: Payer: Self-pay | Admitting: Internal Medicine

## 2012-05-13 VITALS — BP 134/82 | HR 126 | Ht 62.0 in | Wt 113.0 lb

## 2012-05-13 DIAGNOSIS — K5090 Crohn's disease, unspecified, without complications: Secondary | ICD-10-CM

## 2012-05-13 DIAGNOSIS — L403 Pustulosis palmaris et plantaris: Secondary | ICD-10-CM

## 2012-05-13 DIAGNOSIS — L408 Other psoriasis: Secondary | ICD-10-CM

## 2012-05-13 DIAGNOSIS — T50905A Adverse effect of unspecified drugs, medicaments and biological substances, initial encounter: Secondary | ICD-10-CM

## 2012-05-13 DIAGNOSIS — L409 Psoriasis, unspecified: Secondary | ICD-10-CM

## 2012-05-13 DIAGNOSIS — T887XXA Unspecified adverse effect of drug or medicament, initial encounter: Secondary | ICD-10-CM

## 2012-05-13 LAB — CBC
HCT: 43.1 % (ref 36.0–46.0)
Hemoglobin: 14.6 g/dL (ref 12.0–15.0)
Platelets: 258 10*3/uL (ref 150.0–400.0)
RDW: 14.2 % (ref 11.5–14.6)
WBC: 6.9 10*3/uL (ref 4.5–10.5)

## 2012-05-13 LAB — COMPREHENSIVE METABOLIC PANEL
CO2: 25 mEq/L (ref 19–32)
Calcium: 9.7 mg/dL (ref 8.4–10.5)
Chloride: 107 mEq/L (ref 96–112)
Creatinine, Ser: 0.7 mg/dL (ref 0.4–1.2)
GFR: 134.68 mL/min (ref >60.00)
Glucose, Bld: 84 mg/dL (ref 70–99)
Total Bilirubin: 0.7 mg/dL (ref 0.3–1.2)
Total Protein: 7.7 g/dL (ref 6.0–8.3)

## 2012-05-13 MED ORDER — PREDNISONE 10 MG PO TABS: 10.0000 mg | ORAL_TABLET | Freq: Every day | ORAL | Status: DC

## 2012-05-13 NOTE — Progress Notes (Signed)
Subjective:    Patient ID: Brittany Le, female    DOB: 1985-06-11, 27 y.o.   MRN: [id number]  HPI Brittany Le) is a 27 year old female with ileocolonic Crohn's disease who is seen in followup. She started Humira in September 2013, and when last seen in October 2013 was feeling great. She was having no abdominal pain with normal bowel movements, which were formed and nonbloody. She was eating well without nausea or vomiting. At the office visit in October she was having acne changes on her scalp which was felt to be steroid-induced, as she had been on prednisone for her Crohn's disease. Then, later in November she began to develop pustular and plaque-like changes on her palms and soles of her feet.  This continued throughout early December and spread to involve plaque-like changes in her scalp, face, and diffusely over the skin of her abdomen, back, and both legs. She has more pustular changes in her axilla and groin.  She's had diffuse and severe itching.  She stop the Humira very shortly after developing the palmar and plantar skin changes, thus her last Humira dose was in late November 2013.  She was seen by Dr. Ronnald Ramp, with [facility] dermatology and given a short steroid taper.  She also reports having biopsies done which she was told were negative.  The prednisone did not seem to help the rash but did very transiently improved her pruritus.  Today she reports that she is very "uncomfortable", and the itching is intense.  She denies pain. She is using Benadryl which helps some with the itching but is also quite sedating for her.  She has bought topical creams OTC for "psoriasis". She does report plaque eruptions throughout her scalp which has led to significant hair loss.  She was laid off her earlier in the year from [employer], and has yet to find other employment, though she does not feel that she can work in her current state.  From a bowel standpoint, she continues to deny abdominal  pain and diarrhea. She denies blood in her stool or melena. She is eating well. From a Crohn's standpoint she reports "I'm fine".  Review of Systems As per HPI, otherwise negative  Current Medications, Allergies, Past Medical History, Past Surgical History, Family History and Social History were reviewed in [employer] record.     Objective:   Physical Exam BP 134/82  Pulse 126  Ht 5' 2"  (1.575 m)  Wt 113 lb (51.256 kg)  BMI 20.67 kg/m2 Constitutional: Well-developed and well-nourished. No distress. HEENT: Normocephalic and atraumatic. Oropharynx is clear and moist. No oropharyngeal exudate. Conjunctivae are normal.  No scleral icterus. Neck: Neck supple. Trachea midline. Extremities: no clubbing, cyanosis, or edema Neurological: Alert and oriented to person place and time. Skin: Diffuse plaque-like or options on her face, scalp, anterior chest and abdomen , entire back, and diffusely on the fronts and backs of her legs.  Confluence more pustular changes bilateral axilla and groin, denuded areas with erythema bilateral palms towards the wrist, and on the soles of each foot. Psychiatric: Normal mood and affect. Behavior is normal.     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  Diffuse, severe skin rash/psoriatic-like -- I feel that her severe skin reaction is secondary to Humira therapy.  Palmoplantar psoriatic-like skin reactions have been described with anti-TNF alpha therapies,  adalimumab and infliximab specifically.  At this point I feel that she needs to be seen by dermatology, and they request Dr. Lois Huxley at [facility].  I will contact Dr. Ike Bene office to try to get her an urgent appointment. In the interim I will start her on a prednisone taper to try to help with her sore ice this and  itching. She will likely need a more prolonged taper to provide benefit. Further therapy decisions will be left to the dermatologist.  She is off Humira, again which is felt to be the culprit, and this has been added to her allergy list.  She also will continue to use Benadryl per box instructions on an as-needed basis for itching.  2.  Ileocolonic Crohn's -- currently in remission, not addressed in a major role today.  However, another anti-TNF therapy will not be prescribed based on her psoriatic skin eruption, see #1  I will check CBC and CMP today

## 2012-05-13 NOTE — Telephone Encounter (Signed)
Called pt regarding her Rx she wants "resent" to her pharmacy. Rx was hard copy given to pt. lvm for her to call me back.

## 2012-05-13 NOTE — Patient Instructions (Addendum)
We have sent the following medications to your pharmacy for you to pick up at your convenience: Prednisone taper  Start off with 68m for 7 days, then decrease 551mevery 7 days until done.  You will hear from [place]erm[place]ology referral.   Continue taking benadryl per box instructions

## 2012-05-17 ENCOUNTER — Telehealth: Payer: Self-pay | Admitting: Internal Medicine

## 2012-05-17 NOTE — Telephone Encounter (Signed)
lvm for pt to call me back regarding her referral to [facility].

## 2012-05-17 NOTE — Telephone Encounter (Signed)
Pt wants to know about her referral.

## 2012-05-18 ENCOUNTER — Telehealth: Payer: Self-pay | Admitting: Gastroenterology

## 2012-05-18 NOTE — Telephone Encounter (Signed)
Called Dr. Dwaine Deter office to check on the referral for pt. I was told pt is scheduled with Dr. Lyman Speller on 05/25/2012 @ 8:45am and pt is aware of the appointment.

## 2012-05-19 ENCOUNTER — Telehealth: Payer: Self-pay | Admitting: *Deleted

## 2012-05-19 NOTE — Telephone Encounter (Signed)
Spoke with pt after her visit and she stated she was very much relieved she had been seen. She stated they brought several med students/ interns ? in to see her; they even took pictures! Biopsies were taken and until they know for sure, it might be SKIN LUPUS. She states she was given creams and lotions.

## 2012-05-19 NOTE — Telephone Encounter (Signed)
Pt called from Dr Ike Bene ofc at [facility] and apparently she misunderstood her appt day and time and they are telling her the appt isn't until next week. After speaking to staff there, they were able to work her in today.

## 2012-05-20 NOTE — Telephone Encounter (Signed)
Pt is scheduled for 06/14/12 at 0900. She is feeling much better today; peace of mind she guesses. She will f/u with [facility] in 2 weeks.

## 2012-05-20 NOTE — Telephone Encounter (Signed)
I am very glad they saw her too.  Please have her keep [place] updated.  I would like to see her in about 1 month

## 2012-06-08 ENCOUNTER — Encounter: Payer: Self-pay | Admitting: Internal Medicine

## 2012-06-14 ENCOUNTER — Ambulatory Visit (INDEPENDENT_AMBULATORY_CARE_PROVIDER_SITE_OTHER): Payer: BC Managed Care – PPO | Admitting: Internal Medicine

## 2012-06-14 ENCOUNTER — Encounter: Payer: Self-pay | Admitting: Internal Medicine

## 2012-06-14 VITALS — BP 132/90 | HR 93 | Ht 62.0 in | Wt 115.4 lb

## 2012-06-14 DIAGNOSIS — L403 Pustulosis palmaris et plantaris: Secondary | ICD-10-CM

## 2012-06-14 DIAGNOSIS — K5080 Crohn's disease of both small and large intestine without complications: Secondary | ICD-10-CM

## 2012-06-14 DIAGNOSIS — L408 Other psoriasis: Secondary | ICD-10-CM

## 2012-06-14 NOTE — Patient Instructions (Addendum)
Follow up with Dr. Hilarie Fredrickson in office in 3 months

## 2012-06-14 NOTE — Progress Notes (Signed)
Patient ID: Brittany Le, female   DOB: 18-Jul-1985, 27 y.o.   MRN: [id number]  SUBJECTIVE: HPI [name]) is a 27 year old female with ileocolonic Crohn's disease who is seen in followup. 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  We had referred her to [facility] for dermatology evaluation, and she was seen there.  She is pleased with their service and she was started on topical creams and her prednisone was continued. She relates to me that they're unsure the exact etiology of her skin eruption/rash, and they have been hesitant to remove prednisone entirely. She reports having a skin biopsy which showed dermatitis, but they did not feel this was the only explanation. Lab work was done to evaluate for lupus, and it does not sound like a definitive diagnosis has been made. She has followup with them in a few weeks. Overall her skin has improved in that she is no longer itching or hurting. She did continue to lose her hair, and is now wearing a wig.  Her palms and soles are much improved, but the hyperpigmented skin spots have remained.  She is much more upbeat today. No abdominal pain, diarrhea. She is eating well with a good appetite. No nausea or vomiting. No fevers. No evidence of Crohn's flare.  No joint pains.  She continues to look for a job, has an upcoming interview with [employer] and possibly [employer]  Review of Systems  As per history of present illness, otherwise negative   Past Medical History  Diagnosis Date  . Asthma   . Allergy     SEASONAL  . Anemia   . Chronic kidney disease     KIDNEY REFLUX  . Crohn's disease   . 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 albuterol (PROVENTIL) (2.5 MG/3ML) 0.083% nebulizer solution Take 2.5 mg  by nebulization every 6 (six) hours as needed.        . predniSONE (DELTASONE) 10 MG tablet Take 1 tablet (10 mg total) by mouth daily.  120 tablet  0    Allergies  Allergen Reactions  . Humira (Adalimumab) Other (See Comments)    Pt had Plantar/Palmar Pustular Psoriasis reaction per dermatologist.  . Procaine Hcl Swelling    Family History  Problem Relation Age of Onset  . Diabetes Maternal Grandmother   . Diabetes Paternal Grandmother   . Heart disease Mother   . Colon cancer Neg Hx   . Hypertension      Both sides    History  Substance Use Topics  . Smoking status: Never Smoker   . Smokeless tobacco: Never Used  . Alcohol Use: Yes     Comment: occ.    OBJECTIVE: BP 132/90  Pulse 93  Ht 5' 2"  (1.575 m)  Wt 115 lb 6.4 oz (52.345 kg)  BMI 21.11 kg/m2  SpO2 99% Constitutional: Well-developed and well-nourished. No distress. HEENT: Normocephalic and atraumatic. Oropharynx is clear and moist. No oropharyngeal exudate. Conjunctivae are normal. No scleral icterus. Improving facial lesions with some hyperpigmentation in the left cheek Neck: Neck supple. Trachea midline. Cardiovascular: Normal rate, regular rhythm and intact distal pulses. No M/R/G Pulmonary/chest: Effort normal and breath sounds normal. No wheezing, rales or rhonchi. Abdominal: Soft, nontender,  nondistended. Bowel sounds active throughout. There are no masses palpable. No hepatosplenomegaly. Extremities: no clubbing, cyanosis, or edema Neurological: Alert and oriented to person place and time. Skin: Skin is warm and dry. Bilateral palms are much improved without any remaining ulceration or cracked skin.  She has diffuse hyperpigmented macules on her upper extremities chest abdomen back and legs. They're no longer scaly without any evidence for ulceration or flaking/cracked skin Psychiatric: Normal mood and affect. Behavior is normal.  ASSESSMENT AND PLAN: 27 year old female with ileocolonic Crohn's disease  who is seen in followup.   1.  Lupus-like versus psoriasis-like skin reaction -- I still feel that Humira induced skin reaction is the most likely etiology for her severe rash. I very much appreciate the expert dermatology opinion from [facility].  It appears like the driving force time the skin reaction is in control, and if this was medication induced it should not continue. It sounds like there is a possibility of a second autoimmune conditions such as lupus, rather than a Humira induced skin reaction.  His workup is ongoing with her dermatologist. She still on prednisone 10 mg, and I'll leave the decision to tapering this further to her dermatologist. She is concerned about the cosmetic appearance of her skin, but thankful that she has improved to this point. Hopefully the hyperpigmented skin will fade over time, and perhaps may be able to have dermatology input from a cosmetic standpoint once the condition is under control or resolved.  2.  Crohn's -- in remission currently, and we have discussed how this will likely continue to be the case as long as she is on prednisone. I feel her Humira was working nicely for her Crohn's, but obviously this was stopped due to #1.  For now I will not initiate any other Crohn's therapy, and she is asked to let me know immediately should she develop abdominal pain, diarrhea, blood in her stools, or other signs for active disease. She understands this and will call us if this happens.  Immunomodulator therapy would likely be the next step if and when we reinitiate maintenance therapy for IBD.  I will see her back in about 3 months, sooner if necessary

## 2012-09-30 ENCOUNTER — Telehealth: Payer: Self-pay | Admitting: Internal Medicine

## 2012-09-30 MED ORDER — AMBULATORY NON FORMULARY MEDICATION: Status: DC

## 2012-09-30 NOTE — Telephone Encounter (Signed)
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 Mailed letter to her and a [place] script.

## 2013-02-15 ENCOUNTER — Telehealth: Payer: Self-pay | Admitting: Internal Medicine

## 2013-02-15 NOTE — Telephone Encounter (Signed)
Pt needs a letter for work ; since she has no hair, she would like to wear turbans alternating with her wigs. Emailed it to her.

## 2013-05-12 ENCOUNTER — Telehealth: Payer: Self-pay | Admitting: Internal Medicine

## 2013-05-12 NOTE — Telephone Encounter (Signed)
lmom for pt to call back

## 2013-05-17 NOTE — Telephone Encounter (Signed)
Pt wanted a letter for work because she missed a few days for a possible flare. Emailed her the letter.

## 2013-08-09 ENCOUNTER — Telehealth: Payer: Self-pay | Admitting: Internal Medicine

## 2013-08-09 NOTE — Telephone Encounter (Signed)
Left message for patient to call back  

## 2013-08-10 NOTE — Telephone Encounter (Signed)
Patient notified I will mail her the letter Follow up scheduled for 10/04/13

## 2013-08-10 NOTE — Telephone Encounter (Signed)
Letter written Left message for patient to call back

## 2013-08-10 NOTE — Telephone Encounter (Signed)
Patient is in danger of losing her job, due to absences from work due to her crohn's.  She needs a letter stating she needs the ability to have additional bathroom breaks due to diarrhea and crohn's.  Here is the list of dates that she wants a letter approving off.  Is it ok to write the letter?   These are the dates.. Thank you!   Sick Hours Used 02/14/13 - 4.68hrs(Approved Sick) 02/22/13 - 8hrs 03/13/13 - 2.8hrs p/u lab work 03/15/13 - .3hrs sick/t[place]y 03/28/13 - 8hrs sick 03/29/13 - 4.68hrs leave early sick 04/10/13 - 1.32hrs late sick 05/06/13 - 8hrs sick --------------------------------------- 05/31/13 - 8 HRS SICK 06/05/13 - 2.25 HRS Sick Appointment 07/01/13 - 4.00 HRS Sick 07/04/13 - 8.00 HRS Sick 07/24/13 - 8.00 HRS Sick

## 2013-08-10 NOTE — Telephone Encounter (Signed)
Yes, okay for letter She needs follow-up office visit with me, unless she is seeing someone at [facility] for her IBD

## 2013-09-28 ENCOUNTER — Encounter: Payer: Self-pay | Admitting: Internal Medicine

## 2013-10-04 ENCOUNTER — Ambulatory Visit: Payer: BC Managed Care – PPO | Admitting: Internal Medicine

## 2013-10-20 ENCOUNTER — Telehealth: Payer: Self-pay | Admitting: Internal Medicine

## 2013-10-20 NOTE — Telephone Encounter (Signed)
Dr. Hilarie Fredrickson does have her paperwork, but has several questions for the patient.  Last office visit was 06/2012.  He would like to know if she is seeing another MD, what job duties is she able to perform or not perform and what the status of her crohn's is. Left message for patient to call back

## 2013-10-20 NOTE — Telephone Encounter (Signed)
I spoke with the patient needs FMLA stating she needs additional bathroom breaks and time off for flares if needed.  She is scheduled for follow up 11/28/13

## 2013-11-28 ENCOUNTER — Encounter: Payer: Self-pay | Admitting: Internal Medicine

## 2013-11-28 ENCOUNTER — Other Ambulatory Visit (INDEPENDENT_AMBULATORY_CARE_PROVIDER_SITE_OTHER): Payer: BC Managed Care – PPO

## 2013-11-28 ENCOUNTER — Ambulatory Visit (INDEPENDENT_AMBULATORY_CARE_PROVIDER_SITE_OTHER): Payer: BC Managed Care – PPO | Admitting: Internal Medicine

## 2013-11-28 VITALS — BP 104/80 | HR 78 | Ht 62.0 in | Wt 102.2 lb

## 2013-11-28 DIAGNOSIS — F0630 Mood disorder due to known physiological condition, unspecified: Secondary | ICD-10-CM

## 2013-11-28 DIAGNOSIS — K50818 Crohn's disease of both small and large intestine with other complication: Secondary | ICD-10-CM

## 2013-11-28 DIAGNOSIS — L403 Pustulosis palmaris et plantaris: Secondary | ICD-10-CM

## 2013-11-28 DIAGNOSIS — K5080 Crohn's disease of both small and large intestine without complications: Secondary | ICD-10-CM

## 2013-11-28 DIAGNOSIS — L408 Other psoriasis: Secondary | ICD-10-CM

## 2013-11-28 LAB — CBC WITH DIFFERENTIAL/PLATELET
Basophils Absolute: 0 10*3/uL (ref 0.0–0.1)
Basophils Relative: 0.4 % (ref 0.0–3.0)
EOS PCT: 4.2 % (ref 0.0–5.0)
Eosinophils Absolute: 0.2 10*3/uL (ref 0.0–0.7)
HCT: 44.6 % (ref 36.0–46.0)
Hemoglobin: 15 g/dL (ref 12.0–15.0)
LYMPHS PCT: 33.7 % (ref 12.0–46.0)
Lymphs Abs: 1.6 10*3/uL (ref 0.7–4.0)
MCHC: 33.6 g/dL (ref 30.0–36.0)
MCV: 91.3 fl (ref 78.0–100.0)
MONOS PCT: 4.7 % (ref 3.0–12.0)
Monocytes Absolute: 0.2 10*3/uL (ref 0.1–1.0)
NEUTROS PCT: 57 % (ref 43.0–77.0)
Neutro Abs: 2.8 10*3/uL (ref 1.4–7.7)
Platelets: 219 10*3/uL (ref 150.0–400.0)
RBC: 4.89 Mil/uL (ref 3.87–5.11)
RDW: 13.5 % (ref 11.5–15.5)
WBC: 4.9 10*3/uL (ref 4.0–10.5)

## 2013-11-28 LAB — COMPREHENSIVE METABOLIC PANEL
ALBUMIN: 4.8 g/dL (ref 3.5–5.2)
ALT: 12 U/L (ref 0–35)
AST: 17 U/L (ref 0–37)
Alkaline Phosphatase: 70 U/L (ref 39–117)
BUN: 10 mg/dL (ref 6–23)
CO2: 28 mEq/L (ref 19–32)
Calcium: 10.1 mg/dL (ref 8.4–10.5)
Chloride: 104 mEq/L (ref 96–112)
Creatinine, Ser: 0.6 mg/dL (ref 0.4–1.2)
GFR: 140.46 mL/min (ref >60.00)
GLUCOSE: 80 mg/dL (ref 70–99)
POTASSIUM: 4.5 meq/L (ref 3.5–5.1)
Sodium: 138 mEq/L (ref 135–145)
TOTAL PROTEIN: 7.8 g/dL (ref 6.0–8.3)
Total Bilirubin: 1 mg/dL (ref 0.2–1.2)

## 2013-11-28 LAB — IBC PANEL
IRON: 45 ug/dL (ref 42–145)
Saturation Ratios: 14 % — ABNORMAL LOW (ref 20.0–50.0)
Transferrin: 229.5 mg/dL (ref 212.0–360.0)

## 2013-11-28 LAB — HIGH SENSITIVITY CRP: CRP HIGH SENSITIVITY: 0.37 mg/L (ref 0.000–5.000)

## 2013-11-28 MED ORDER — VSL#3 DS PO PACK: PACK | ORAL | Status: DC

## 2013-11-28 MED ORDER — HYOSCYAMINE SULFATE 0.125 MG SL SUBL: SUBLINGUAL_TABLET | SUBLINGUAL | Status: DC

## 2013-11-28 NOTE — Progress Notes (Signed)
Patient ID: Brittany Le, female   DOB: 1985/09/19, 28 y.o.   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 She was last seen in April 2014. At this time she was being followed closely in [facility] [facility] by Brittany Le for her severe dermatitis. This caused hair loss as well as blistering of Brittany hands and feet. Brittany rash became diffuse to involve her whole body. She reports she was treated initially with oral steroids and then methotrexate. He also used topical creams and light therapy. She reports she responded very well to methotrexate and was placed on a taper. Her methotrexate stopped in April. She was last seen by Le early in 2015, but states she stopped it due to Brittany financial burdens these appointments were putting on her. Initially her Crohn's disease have been under better control but she does think she's had several flares in Brittany past few months. She reports April was particularly a bad month for her. Currently she is feeling okay from a GI perspective but is having fatigue and also emotional stress. A long-term relationship dissolved in October 2014. She reports her boyfriend left when she had a shave her head or scalp treatments. His leaving has called emotional and financial stresses for her. From a GI perspective she reports currently she is having 3-4 formed bowel movements a day without blood or melena. She does feel that she's had issues with external hemorrhoids in Brittany last few months. In April she was having loose watery stools occurring 8-10 times daily. When she is having frequent stools she reports lower abdominal cramping pain which can radiate to her back. Appetite has been so-so she has lost about 10 pounds. She denies heartburn, nausea or vomiting. Overall her skin has improved dramatically  and she denies itching, new rashes or pain. She is not currently taking any medications. She continues to work for [employer].  Past Medical History  Diagnosis Date  . Asthma   . Allergy     SEASONAL  . Anemia   . Chronic kidney disease     KIDNEY REFLUX  . Crohn's disease   . 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 Hernia repair      x 3 as a child  . Tonsillectomy      Outpatient Prescriptions Prior to Visit  Medication Sig Dispense Refill  . albuterol (PROVENTIL) (2.5 MG/3ML) 0.083% nebulizer solution Take 2.5 mg by nebulization every 6 (six) hours as needed.        . AMBULATORY NON FORMULARY MEDICATION Please provide treatment for Alopecia such as a wig or other material or equipment, etc related total hair loss due to a reaction to Humira.  1 each  3  . predniSONE (DELTASONE) 10 MG tablet Take 1 tablet (10 mg total) by mouth daily.  120 tablet  0   No facility-administered medications prior to visit.    Allergies  Allergen Reactions  . Humira [Adalimumab] Other (See Comments)    Pt had Plantar/Palmar Pustular Psoriasis reaction per dermatologist.  . Procaine Hcl Swelling    Family History  Problem Relation Age of Onset  . Diabetes Maternal Grandmother   . Diabetes Paternal Grandmother   . Heart disease Mother   . Colon cancer Neg Hx   .  Hypertension      Both sides    History  Substance Use Topics  . Smoking status: Never Smoker   . Smokeless tobacco: Never Used  . Alcohol Use: Yes     Comment: occ.    ROS: As per history of present illness, otherwise negative  BP 104/80  Pulse 78  Ht 5' 2"  (1.575 m)  Wt 102 lb 3.2 oz (46.358 kg)  BMI 18.69 kg/m2 Constitutional: Well-developed and well-nourished. No distress. HEENT: Normocephalic and atraumatic. Oropharynx is clear and moist. No oropharyngeal exudate. Conjunctivae are normal.  No scleral icterus. Neck: Neck supple. Trachea  midline. Cardiovascular: Normal rate, regular rhythm and intact distal pulses. No M/R/G Pulmonary/chest: Effort normal and breath sounds normal. No wheezing, rales or rhonchi. Abdominal: Soft, mild lower abdominal tenderness without rebound or guarding, nondistended. Bowel sounds active throughout. There are no masses palpable. No hepatosplenomegaly. Extremities: no clubbing, cyanosis, or edema Lymphadenopathy: No cervical adenopathy noted. Neurological: Alert and oriented to person place and time. Skin: Skin is warm and dry. Scattered hyperpigmented spots on Brittany lower extremity but overall dramatic improvement in skin Psychiatric: Somewhat depressed mood and affect. Behavior is normal.  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  1. Ileocolonic Crohn's -- when she was last seen her Crohn's was felt to be in remission likely secondary to Humira and also Brittany steroids she was taking thereafter for her skin rash. She was also treated with methotrexate which we discussed can improve Crohn's disease. She has been off therapy entirely since April. She has symptoms, which are occurring intermittently, which could be consistent with active Crohn's disease. I recommended obtaining objective evidence for disease activity. I recommended colonoscopy, and she is agreeable to proceed after discussion of Brittany risks and benefits. Labs today to include CBC, CMP, CRP, iron studies, B12 and folate, vitamin D and TPMT.  I would not prescribed anti-TNF therapy given Brittany association with rash, but if disease is active she would likely benefit from immunomodulator therapy.  Have also recommended VSL#3 and levsin to be used as needed and as directed for lower abdominal cramping pain.  2. 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  Depressed mood due to medical condition -- no SI or HI. I have recommended referral to psychology for counseling. She is agreeable. I think she would benefit greatly from talking about Brittany significant impact her Crohn's disease has had on her life including Brittany rash that developed and Brittany loss of her job and relationship.

## 2013-11-28 NOTE — Patient Instructions (Signed)
Your physician has requested that you go to the basement for following lab work before leaving today.  We have sent the following medications to your pharmacy for you to pick up at your convenience: Levsin and VSL # 3.  It has been recommended to you by your physician that you have a(n) Colonoscopy completed. Per your request, we did not schedule the procedure(s) today. Please contact our office at [phone] should you decide to have the procedure completed.  You will be contacted about your Psychology referral.

## 2013-11-29 LAB — FOLATE: Folate: 8.4 ng/mL (ref >5.9)

## 2013-11-29 LAB — VITAMIN B12: Vitamin B-12: 212 pg/mL (ref 211–911)

## 2013-11-29 LAB — VITAMIN D 25 HYDROXY (VIT D DEFICIENCY, FRACTURES): VITD: 10.61 ng/mL

## 2013-11-30 ENCOUNTER — Other Ambulatory Visit: Payer: Self-pay

## 2013-11-30 ENCOUNTER — Telehealth: Payer: Self-pay | Admitting: Internal Medicine

## 2013-11-30 DIAGNOSIS — K50818 Crohn's disease of both small and large intestine with other complication: Secondary | ICD-10-CM

## 2013-11-30 MED ORDER — HYOSCYAMINE SULFATE 0.125 MG SL SUBL: SUBLINGUAL_TABLET | SUBLINGUAL | Status: DC

## 2013-11-30 MED ORDER — VITAMIN D (ERGOCALCIFEROL) 1.25 MG (50000 UNIT) PO CAPS: 50000.0000 [IU] | ORAL_CAPSULE | ORAL | Status: DC

## 2013-11-30 MED ORDER — INTEGRA PLUS PO CAPS: 1.0000 | ORAL_CAPSULE | Freq: Every day | ORAL | Status: DC

## 2013-11-30 NOTE — Telephone Encounter (Signed)
No answer/machine.  See lab results note from 11/29/13 for further documentation

## 2013-12-05 LAB — THIOPURINE METHYLTRANSFERASE (TPMT), RBC: TPMT ACTIVITY: 21.4 U/mL{RBCs}

## 2013-12-14 ENCOUNTER — Ambulatory Visit: Payer: BC Managed Care – PPO | Admitting: Psychiatry

## 2014-07-09 ENCOUNTER — Telehealth: Payer: Self-pay | Admitting: Internal Medicine

## 2014-07-09 NOTE — Telephone Encounter (Signed)
Pt states there is a problem with her FMLA form and that there needs to be a correction made to it. Pt to write down exactly what needs to be done and fax that along with copy of form for [place] to review and possibly correct.

## 2014-07-10 NOTE — Telephone Encounter (Signed)
Left message for pt to call back.  Spoke with pt and form has been corrected and faxed back for pt to [employer]

## 2017-05-27 DIAGNOSIS — J4520 Mild intermittent asthma, uncomplicated: Secondary | ICD-10-CM | POA: Diagnosis not present

## 2017-05-27 DIAGNOSIS — J40 Bronchitis, not specified as acute or chronic: Secondary | ICD-10-CM | POA: Diagnosis not present

## 2017-05-27 DIAGNOSIS — J45901 Unspecified asthma with (acute) exacerbation: Secondary | ICD-10-CM | POA: Diagnosis not present

## 2017-08-03 ENCOUNTER — Ambulatory Visit: Payer: Self-pay | Admitting: Internal Medicine

## 2018-01-13 DIAGNOSIS — G8911 Acute pain due to trauma: Secondary | ICD-10-CM | POA: Diagnosis not present

## 2018-01-13 DIAGNOSIS — S29012A Strain of muscle and tendon of back wall of thorax, initial encounter: Secondary | ICD-10-CM | POA: Diagnosis not present

## 2018-02-05 DIAGNOSIS — H0012 Chalazion right lower eyelid: Secondary | ICD-10-CM | POA: Diagnosis not present

## 2018-02-14 DIAGNOSIS — H0012 Chalazion right lower eyelid: Secondary | ICD-10-CM | POA: Diagnosis not present

## 2018-02-17 DIAGNOSIS — Z124 Encounter for screening for malignant neoplasm of cervix: Secondary | ICD-10-CM | POA: Diagnosis not present

## 2018-02-17 DIAGNOSIS — Z113 Encounter for screening for infections with a predominantly sexual mode of transmission: Secondary | ICD-10-CM | POA: Diagnosis not present

## 2018-02-17 DIAGNOSIS — Z01419 Encounter for gynecological examination (general) (routine) without abnormal findings: Secondary | ICD-10-CM | POA: Diagnosis not present

## 2018-02-17 DIAGNOSIS — Z681 Body mass index (BMI) 19 or less, adult: Secondary | ICD-10-CM | POA: Diagnosis not present

## 2018-02-25 DIAGNOSIS — Z23 Encounter for immunization: Secondary | ICD-10-CM | POA: Diagnosis not present

## 2019-02-24 DIAGNOSIS — Z113 Encounter for screening for infections with a predominantly sexual mode of transmission: Secondary | ICD-10-CM | POA: Diagnosis not present

## 2019-02-24 DIAGNOSIS — Z124 Encounter for screening for malignant neoplasm of cervix: Secondary | ICD-10-CM | POA: Diagnosis not present

## 2019-02-24 DIAGNOSIS — Z681 Body mass index (BMI) 19 or less, adult: Secondary | ICD-10-CM | POA: Diagnosis not present

## 2019-02-24 DIAGNOSIS — Z01411 Encounter for gynecological examination (general) (routine) with abnormal findings: Secondary | ICD-10-CM | POA: Diagnosis not present

## 2019-04-17 DIAGNOSIS — Z23 Encounter for immunization: Secondary | ICD-10-CM | POA: Diagnosis not present

## 2019-04-17 DIAGNOSIS — E440 Moderate protein-calorie malnutrition: Secondary | ICD-10-CM | POA: Diagnosis not present

## 2019-04-17 DIAGNOSIS — E0590 Thyrotoxicosis, unspecified without thyrotoxic crisis or storm: Secondary | ICD-10-CM | POA: Diagnosis not present

## 2019-04-17 DIAGNOSIS — Z0000 Encounter for general adult medical examination without abnormal findings: Secondary | ICD-10-CM | POA: Diagnosis not present

## 2019-04-17 DIAGNOSIS — K5090 Crohn's disease, unspecified, without complications: Secondary | ICD-10-CM | POA: Diagnosis not present

## 2019-05-08 DIAGNOSIS — E0590 Thyrotoxicosis, unspecified without thyrotoxic crisis or storm: Secondary | ICD-10-CM | POA: Diagnosis not present

## 2019-05-08 DIAGNOSIS — K5090 Crohn's disease, unspecified, without complications: Secondary | ICD-10-CM | POA: Diagnosis not present

## 2019-05-08 DIAGNOSIS — R946 Abnormal results of thyroid function studies: Secondary | ICD-10-CM | POA: Diagnosis not present

## 2019-05-08 DIAGNOSIS — Z7189 Other specified counseling: Secondary | ICD-10-CM | POA: Diagnosis not present

## 2019-05-08 DIAGNOSIS — Z681 Body mass index (BMI) 19 or less, adult: Secondary | ICD-10-CM | POA: Diagnosis not present

## 2019-06-05 ENCOUNTER — Encounter: Payer: Self-pay | Admitting: *Deleted

## 2019-06-06 ENCOUNTER — Encounter: Payer: Self-pay | Admitting: Internal Medicine

## 2019-06-06 ENCOUNTER — Ambulatory Visit: Payer: BC Managed Care – PPO | Admitting: Internal Medicine

## 2019-06-06 ENCOUNTER — Other Ambulatory Visit (INDEPENDENT_AMBULATORY_CARE_PROVIDER_SITE_OTHER): Payer: BC Managed Care – PPO

## 2019-06-06 VITALS — BP 102/60 | HR 100 | Temp 98.9°F | Ht 62.0 in | Wt 95.0 lb

## 2019-06-06 DIAGNOSIS — K5080 Crohn's disease of both small and large intestine without complications: Secondary | ICD-10-CM

## 2019-06-06 LAB — IBC + FERRITIN
Ferritin: 21.6 ng/mL (ref 10.0–291.0)
Iron: 28 ug/dL — ABNORMAL LOW (ref 42–145)
Saturation Ratios: 11.1 % — ABNORMAL LOW (ref 20.0–50.0)
Transferrin: 180 mg/dL — ABNORMAL LOW (ref 212.0–360.0)

## 2019-06-06 LAB — VITAMIN B12: Vitamin B-12: 240 pg/mL (ref 211–911)

## 2019-06-06 NOTE — Patient Instructions (Signed)
If you are age 36 or older, your body mass index should be between 23-30. Your Body mass index is 17.38 kg/m. If this is out of the aforementioned range listed, please consider follow up with your Primary Care Provider.  If you are age 30 or younger, your body mass index should be between 19-25. Your Body mass index is 17.38 kg/m. If this is out of the aformentioned range listed, please consider follow up with your Primary Care Provider.   Your provider has requested that you go to the basement level for lab work before leaving today. Press "B" on the elevator. The lab is located at the first door on the left as you exit the elevator.   You have been scheduled for an MRI at [facility] (1st floor radiology) on Friday 06/16/19. Your appointment time is 3 pm. Please arrive by 1 pm - 2 hours prior to your appointment time for registration purposes. Please make certain not to have anything to eat or drink 6 hours prior to your test. In addition, if you have any metal in your body, have a pacemaker or defibrillator, please be sure to let your ordering physician know. This test typically takes 45 minutes to 1 hour to complete. Should you need to reschedule, please call [phone] to do so.

## 2019-06-06 NOTE — Progress Notes (Signed)
Subjective:    Patient ID: Brittany Le, female    DOB: 1986-03-17, 34 y.o.   MRN: [id number]  HPI Brittany Le is a 34 year old female with a history of ileocolonic Crohn's disease diagnosed in 2012, history of allergic reaction to Humira causing severe dermatitis (there was question of a spongiotic dermatitis) treated with methotrexate for quite some time now off of all immunomodulator therapy who returns for follow-up.  She was last seen in July 2015.  She reports she has been doing well.  Her rash completely resolved slowly it faded away with methotrexate and light therapy.  This was done at [facility] but it has been several years since she has been seen.  She is feeling well though she has been unable to gain weight and this prompted her primary care to refer her back to reestablish for her history of Crohn's disease.  From a GI perspective she is feeling fairly well.  With stress and anxiety she can get abdominal cramping but this is infrequent.  She also avoids eating beef as it makes her feel sick.  She tries to stay very hydrated and eat a very healthy diet.  She has 3-4 bowel movements per day which are formed without blood.  No abdominal pain.  No melena.  She has been unable to gain weight.  No upper GI or hepatobiliary complaint.  She is seen by primary care and had a low TSH though her free T3 and T4 were normal.  Her vitamin D was very low and she is completed 6 weeks of 8 weeks of planned high-dose vitamin D therapy.  She will follow up with primary care again in the coming weeks for repeat blood testing.   Review of Systems As per HPI, otherwise negative  Current Medications, Allergies, Past Medical History, Past Surgical History, Family History and Social History were reviewed in [employer] record.     Objective:   Physical Exam BP 102/60 (BP Location: Left Arm, Patient Position: Sitting, Cuff Size: Normal)   Pulse 100   Temp 98.9 F (37.2  C)   Ht 5' 2"  (1.575 m) Comment: height measured without shoes  Wt 95 lb (43.1 kg)   BMI 17.38 kg/m  Gen: awake, alert, NAD HEENT: anicteric CV: RRR, no mrg Pulm: CTA b/l Abd: soft, NT/ND, +BS throughout Ext: no c/c/e Neuro: nonfocal  Blood work for primary care dated 04/17/2019 --normal CBC including hemoglobin at 13.5, MCV 92.9, normal platelet and white count.  Normal comprehensive metabolic panel and liver enzymes.  Albumin is 4.2.  AST and ALT 17 and 12, respectively, total bilirubin 0.5, alkaline phosphatase 56. TSH 0.26, vitamin D 7.1 Free T4 normal at 0.87, free T3 slightly low at 2.49 CRP less than 1      Assessment & Plan:  34 year old female with a history of ileocolonic Crohn's disease diagnosed in 2012, history of allergic reaction to Humira causing severe dermatitis (there was question of a spongiotic dermatitis) treated with methotrexate for quite some time now off of all immunomodulator therapy who returns for follow-up.    1.  Ileocolonic Crohn's disease diagnosed by colonoscopy with consistent pathology showing granulomas --while I do think she definitively had small intestine and colonic Crohn's disease, it appears that she has been able to maintain remission off of medical therapy.  She had a severe dermatologic reaction to Humira and so very understandably she wishes to avoid biologic therapy.  She did take methotrexate for her skin reaction but  is now off of this therapy.  We did discuss how methotrexate can be used in Crohn's disease.  At this point I think we should objectively understand if she has active bowel disease though her low CRP is very reassuring I recommended the following: --MR enterography of the abdomen and pelvis --Check B12 level given history of ileal involvement as well as iron studies --Fecal calprotectin --If either MR enterography or fecal calprotectin suggest active inflammation then consider colonoscopy   2.  Failure to gain weight --could  possibly be thyroid related, primary care is investigating this.  With a normal albumin I do not think that she is having significant issues with vitamin and mineral absorption with her gut.  We are investigating the Crohn's disease as above.  I am also checking B12 and iron studies.  Further recommendations after labs, stool testing and MR

## 2019-06-07 ENCOUNTER — Other Ambulatory Visit: Payer: Self-pay

## 2019-06-07 DIAGNOSIS — K5080 Crohn's disease of both small and large intestine without complications: Secondary | ICD-10-CM

## 2019-06-12 ENCOUNTER — Telehealth: Payer: Self-pay | Admitting: Internal Medicine

## 2019-06-12 DIAGNOSIS — Z681 Body mass index (BMI) 19 or less, adult: Secondary | ICD-10-CM | POA: Diagnosis not present

## 2019-06-12 DIAGNOSIS — R946 Abnormal results of thyroid function studies: Secondary | ICD-10-CM | POA: Diagnosis not present

## 2019-06-12 DIAGNOSIS — K5090 Crohn's disease, unspecified, without complications: Secondary | ICD-10-CM | POA: Diagnosis not present

## 2019-06-12 NOTE — Telephone Encounter (Signed)
Yes okay to do B12 injections if she is interested, 1000 mcg monthly, another option would be intranasal B12 with Nascobal 500 mcg in 1 nostril once weekly

## 2019-06-12 NOTE — Telephone Encounter (Signed)
Please advise 

## 2019-06-12 NOTE — Telephone Encounter (Signed)
Pt stated that during OV it was discussed that pt were to buy B12 OTC.  Pt inquired about B12 injections instead because she stated that she cannot swallow pills.

## 2019-06-14 DIAGNOSIS — Z7189 Other specified counseling: Secondary | ICD-10-CM | POA: Diagnosis not present

## 2019-06-14 DIAGNOSIS — Z681 Body mass index (BMI) 19 or less, adult: Secondary | ICD-10-CM | POA: Diagnosis not present

## 2019-06-14 DIAGNOSIS — R946 Abnormal results of thyroid function studies: Secondary | ICD-10-CM | POA: Diagnosis not present

## 2019-06-14 DIAGNOSIS — E538 Deficiency of other specified B group vitamins: Secondary | ICD-10-CM | POA: Diagnosis not present

## 2019-06-14 DIAGNOSIS — K5090 Crohn's disease, unspecified, without complications: Secondary | ICD-10-CM | POA: Diagnosis not present

## 2019-06-15 ENCOUNTER — Other Ambulatory Visit: Payer: BC Managed Care – PPO

## 2019-06-15 DIAGNOSIS — K5080 Crohn's disease of both small and large intestine without complications: Secondary | ICD-10-CM | POA: Diagnosis not present

## 2019-06-16 ENCOUNTER — Other Ambulatory Visit: Payer: Self-pay

## 2019-06-16 ENCOUNTER — Ambulatory Visit (HOSPITAL_COMMUNITY)
Admission: RE | Admit: 2019-06-16 | Discharge: 2019-06-16 | Disposition: A | Discharge status: Home / Self Care | Payer: BC Managed Care – PPO | Source: Ambulatory Visit | Attending: Internal Medicine | Admitting: Internal Medicine

## 2019-06-16 DIAGNOSIS — K5080 Crohn's disease of both small and large intestine without complications: Secondary | ICD-10-CM | POA: Diagnosis not present

## 2019-06-16 DIAGNOSIS — K5090 Crohn's disease, unspecified, without complications: Secondary | ICD-10-CM | POA: Diagnosis not present

## 2019-06-16 MED ORDER — CYANOCOBALAMIN 1000 MCG/ML IJ SOLN: 1000.0000 ug | Freq: Once | INTRAMUSCULAR | 30 refills | Status: DC

## 2019-06-16 MED ORDER — GADOBUTROL 1 MMOL/ML IV SOLN
4.0000 mL | Freq: Once | INTRAVENOUS | Status: AC | PRN
  Administered 2019-06-16: 4 mL via INTRAVENOUS

## 2019-06-16 NOTE — Telephone Encounter (Signed)
Spoke with patient and she decided she would like to try monthly B12 injections.  We scheduled her first one for a month from now and I sent in a prescription for the B12 .  Patient agreed.

## 2019-06-20 DIAGNOSIS — E559 Vitamin D deficiency, unspecified: Secondary | ICD-10-CM | POA: Diagnosis not present

## 2019-06-21 ENCOUNTER — Other Ambulatory Visit: Payer: Self-pay | Admitting: Obstetrics & Gynecology

## 2019-06-21 DIAGNOSIS — N7011 Chronic salpingitis: Secondary | ICD-10-CM | POA: Diagnosis not present

## 2019-06-21 DIAGNOSIS — N83201 Unspecified ovarian cyst, right side: Secondary | ICD-10-CM

## 2019-06-25 LAB — CALPROTECTIN, FECAL: Calprotectin, Fecal: 42 ug/g (ref 0–120)

## 2019-07-14 ENCOUNTER — Ambulatory Visit (INDEPENDENT_AMBULATORY_CARE_PROVIDER_SITE_OTHER): Payer: BC Managed Care – PPO | Admitting: Internal Medicine

## 2019-07-14 DIAGNOSIS — E538 Deficiency of other specified B group vitamins: Secondary | ICD-10-CM

## 2019-07-14 DIAGNOSIS — K50819 Crohn's disease of both small and large intestine with unspecified complications: Secondary | ICD-10-CM | POA: Diagnosis not present

## 2019-07-14 MED ORDER — CYANOCOBALAMIN 1000 MCG/ML IJ SOLN
1000.0000 ug | INTRAMUSCULAR | Status: DC
  Administered 2019-07-14 – 2020-12-05 (×13): 1000 ug via INTRAMUSCULAR

## 2019-07-19 ENCOUNTER — Ambulatory Visit
Admission: RE | Admit: 2019-07-19 | Discharge: 2019-07-19 | Disposition: A | Discharge status: Home / Self Care | Payer: BC Managed Care – PPO | Source: Ambulatory Visit | Attending: Obstetrics & Gynecology | Admitting: Obstetrics & Gynecology

## 2019-07-19 DIAGNOSIS — N83201 Unspecified ovarian cyst, right side: Secondary | ICD-10-CM | POA: Diagnosis not present

## 2019-07-19 DIAGNOSIS — N83202 Unspecified ovarian cyst, left side: Secondary | ICD-10-CM | POA: Diagnosis not present

## 2019-07-21 DIAGNOSIS — N83202 Unspecified ovarian cyst, left side: Secondary | ICD-10-CM | POA: Diagnosis not present

## 2019-07-21 DIAGNOSIS — N83201 Unspecified ovarian cyst, right side: Secondary | ICD-10-CM | POA: Diagnosis not present

## 2019-08-16 ENCOUNTER — Telehealth: Payer: Self-pay | Admitting: Internal Medicine

## 2019-08-16 ENCOUNTER — Other Ambulatory Visit: Payer: Self-pay

## 2019-08-16 NOTE — Telephone Encounter (Signed)
Pt scheduled for B12 injection 08/18/19 at 11am, she is aware of appt.

## 2019-08-18 ENCOUNTER — Other Ambulatory Visit: Payer: Self-pay

## 2019-08-18 ENCOUNTER — Ambulatory Visit (INDEPENDENT_AMBULATORY_CARE_PROVIDER_SITE_OTHER): Payer: BC Managed Care – PPO | Admitting: Internal Medicine

## 2019-08-18 DIAGNOSIS — E538 Deficiency of other specified B group vitamins: Secondary | ICD-10-CM | POA: Diagnosis not present

## 2019-08-18 MED ORDER — CYANOCOBALAMIN 1000 MCG/ML IJ SOLN
1000.0000 ug | Freq: Once | INTRAMUSCULAR | Status: AC
  Administered 2019-08-18: 1000 ug via INTRAMUSCULAR

## 2019-08-18 NOTE — Progress Notes (Signed)
Pt reminded to come for lab work at the end of the month, orders in epic.

## 2019-09-01 DIAGNOSIS — H0011 Chalazion right upper eyelid: Secondary | ICD-10-CM | POA: Diagnosis not present

## 2019-09-08 ENCOUNTER — Other Ambulatory Visit (INDEPENDENT_AMBULATORY_CARE_PROVIDER_SITE_OTHER): Payer: BC Managed Care – PPO

## 2019-09-08 DIAGNOSIS — K5080 Crohn's disease of both small and large intestine without complications: Secondary | ICD-10-CM

## 2019-09-08 LAB — IBC + FERRITIN
Ferritin: 17.4 ng/mL (ref 10.0–291.0)
Iron: 14 ug/dL — ABNORMAL LOW (ref 42–145)
Saturation Ratios: 4.2 % — ABNORMAL LOW (ref 20.0–50.0)
Transferrin: 237 mg/dL (ref 212.0–360.0)

## 2019-09-08 LAB — VITAMIN B12: Vitamin B-12: 326 pg/mL (ref 211–911)

## 2019-09-12 ENCOUNTER — Other Ambulatory Visit: Payer: Self-pay

## 2019-09-12 DIAGNOSIS — K50819 Crohn's disease of both small and large intestine with unspecified complications: Secondary | ICD-10-CM

## 2019-09-12 MED ORDER — FERROUS SULFATE 325 (65 FE) MG PO TBEC: 325.0000 mg | DELAYED_RELEASE_TABLET | Freq: Two times a day (BID) | ORAL | 3 refills | Status: DC

## 2019-09-15 ENCOUNTER — Other Ambulatory Visit: Payer: Self-pay

## 2019-09-15 ENCOUNTER — Ambulatory Visit (INDEPENDENT_AMBULATORY_CARE_PROVIDER_SITE_OTHER): Payer: BC Managed Care – PPO | Admitting: Internal Medicine

## 2019-09-15 DIAGNOSIS — E538 Deficiency of other specified B group vitamins: Secondary | ICD-10-CM | POA: Diagnosis not present

## 2019-09-15 DIAGNOSIS — K50819 Crohn's disease of both small and large intestine with unspecified complications: Secondary | ICD-10-CM | POA: Diagnosis not present

## 2019-10-16 ENCOUNTER — Ambulatory Visit (INDEPENDENT_AMBULATORY_CARE_PROVIDER_SITE_OTHER): Payer: BC Managed Care – PPO | Admitting: Internal Medicine

## 2019-10-16 DIAGNOSIS — E538 Deficiency of other specified B group vitamins: Secondary | ICD-10-CM | POA: Diagnosis not present

## 2019-10-16 MED ORDER — CYANOCOBALAMIN 1000 MCG/ML IJ SOLN
1000.0000 ug | Freq: Once | INTRAMUSCULAR | Status: AC
  Administered 2019-10-16: 1000 ug via INTRAMUSCULAR

## 2019-10-24 DIAGNOSIS — N898 Other specified noninflammatory disorders of vagina: Secondary | ICD-10-CM | POA: Diagnosis not present

## 2019-10-31 DIAGNOSIS — F322 Major depressive disorder, single episode, severe without psychotic features: Secondary | ICD-10-CM | POA: Diagnosis not present

## 2019-10-31 DIAGNOSIS — F321 Major depressive disorder, single episode, moderate: Secondary | ICD-10-CM | POA: Diagnosis not present

## 2019-11-14 ENCOUNTER — Ambulatory Visit (INDEPENDENT_AMBULATORY_CARE_PROVIDER_SITE_OTHER): Payer: BC Managed Care – PPO | Admitting: Internal Medicine

## 2019-11-14 DIAGNOSIS — K50819 Crohn's disease of both small and large intestine with unspecified complications: Secondary | ICD-10-CM | POA: Diagnosis not present

## 2019-11-14 DIAGNOSIS — E538 Deficiency of other specified B group vitamins: Secondary | ICD-10-CM

## 2019-11-14 DIAGNOSIS — D509 Iron deficiency anemia, unspecified: Secondary | ICD-10-CM

## 2019-11-22 DIAGNOSIS — F4323 Adjustment disorder with mixed anxiety and depressed mood: Secondary | ICD-10-CM | POA: Diagnosis not present

## 2019-11-27 DIAGNOSIS — F322 Major depressive disorder, single episode, severe without psychotic features: Secondary | ICD-10-CM | POA: Diagnosis not present

## 2019-12-12 ENCOUNTER — Ambulatory Visit (INDEPENDENT_AMBULATORY_CARE_PROVIDER_SITE_OTHER): Payer: BC Managed Care – PPO | Admitting: Internal Medicine

## 2019-12-12 DIAGNOSIS — E538 Deficiency of other specified B group vitamins: Secondary | ICD-10-CM | POA: Diagnosis not present

## 2019-12-12 DIAGNOSIS — K50819 Crohn's disease of both small and large intestine with unspecified complications: Secondary | ICD-10-CM | POA: Diagnosis not present

## 2019-12-13 ENCOUNTER — Telehealth: Payer: Self-pay

## 2019-12-13 NOTE — Telephone Encounter (Signed)
Spoke with patient to remind her that she is due for repeat labs, advised patient of lab hours and she is aware that no appt is necessary.

## 2019-12-13 NOTE — Telephone Encounter (Signed)
-----   Message from Algernon Huxley, RN sent at 09/12/2019 11:50 AM EDT ----- Regarding: Labs Pt needs labs, orders in epic

## 2019-12-19 ENCOUNTER — Other Ambulatory Visit: Payer: Self-pay

## 2019-12-19 ENCOUNTER — Other Ambulatory Visit (INDEPENDENT_AMBULATORY_CARE_PROVIDER_SITE_OTHER): Payer: BC Managed Care – PPO

## 2019-12-19 DIAGNOSIS — K50819 Crohn's disease of both small and large intestine with unspecified complications: Secondary | ICD-10-CM | POA: Diagnosis not present

## 2019-12-19 DIAGNOSIS — D509 Iron deficiency anemia, unspecified: Secondary | ICD-10-CM

## 2019-12-19 LAB — CBC WITH DIFFERENTIAL/PLATELET
Basophils Absolute: 0.2 10*3/uL — ABNORMAL HIGH (ref 0.0–0.1)
Basophils Relative: 2.5 % (ref 0.0–3.0)
Eosinophils Absolute: 0.2 10*3/uL (ref 0.0–0.7)
Eosinophils Relative: 3.2 % (ref 0.0–5.0)
HCT: 40.4 % (ref 36.0–46.0)
Hemoglobin: 13.7 g/dL (ref 12.0–15.0)
Lymphocytes Relative: 34.5 % (ref 12.0–46.0)
Lymphs Abs: 2.2 10*3/uL (ref 0.7–4.0)
MCHC: 33.9 g/dL (ref 30.0–36.0)
MCV: 91.1 fl (ref 78.0–100.0)
Monocytes Absolute: 0.5 10*3/uL (ref 0.1–1.0)
Monocytes Relative: 8.3 % (ref 3.0–12.0)
Neutro Abs: 3.3 10*3/uL (ref 1.4–7.7)
Neutrophils Relative %: 51.5 % (ref 43.0–77.0)
Platelets: 206 10*3/uL (ref 150.0–400.0)
RBC: 4.44 Mil/uL (ref 3.87–5.11)
RDW: 13.7 % (ref 11.5–15.5)
WBC: 6.4 10*3/uL (ref 4.0–10.5)

## 2019-12-19 LAB — IBC + FERRITIN
Ferritin: 32.9 ng/mL (ref 10.0–291.0)
Iron: 33 ug/dL — ABNORMAL LOW (ref 42–145)
Saturation Ratios: 11.1 % — ABNORMAL LOW (ref 20.0–50.0)
Transferrin: 212 mg/dL (ref 212.0–360.0)

## 2020-01-04 DIAGNOSIS — J302 Other seasonal allergic rhinitis: Secondary | ICD-10-CM | POA: Diagnosis not present

## 2020-01-04 DIAGNOSIS — Z87892 Personal history of anaphylaxis: Secondary | ICD-10-CM | POA: Diagnosis not present

## 2020-01-04 DIAGNOSIS — J4520 Mild intermittent asthma, uncomplicated: Secondary | ICD-10-CM | POA: Diagnosis not present

## 2020-01-04 DIAGNOSIS — Z880 Allergy status to penicillin: Secondary | ICD-10-CM | POA: Diagnosis not present

## 2020-01-09 DIAGNOSIS — N8301 Follicular cyst of right ovary: Secondary | ICD-10-CM | POA: Diagnosis not present

## 2020-01-09 DIAGNOSIS — Z87892 Personal history of anaphylaxis: Secondary | ICD-10-CM | POA: Diagnosis not present

## 2020-01-09 DIAGNOSIS — N8302 Follicular cyst of left ovary: Secondary | ICD-10-CM | POA: Diagnosis not present

## 2020-01-09 DIAGNOSIS — T63451A Toxic effect of venom of hornets, accidental (unintentional), initial encounter: Secondary | ICD-10-CM | POA: Diagnosis not present

## 2020-01-09 DIAGNOSIS — N83209 Unspecified ovarian cyst, unspecified side: Secondary | ICD-10-CM | POA: Diagnosis not present

## 2020-01-16 ENCOUNTER — Ambulatory Visit (INDEPENDENT_AMBULATORY_CARE_PROVIDER_SITE_OTHER): Payer: BC Managed Care – PPO | Admitting: Internal Medicine

## 2020-01-16 DIAGNOSIS — E538 Deficiency of other specified B group vitamins: Secondary | ICD-10-CM | POA: Diagnosis not present

## 2020-02-15 ENCOUNTER — Ambulatory Visit (INDEPENDENT_AMBULATORY_CARE_PROVIDER_SITE_OTHER): Payer: BC Managed Care – PPO | Admitting: Internal Medicine

## 2020-02-15 DIAGNOSIS — E538 Deficiency of other specified B group vitamins: Secondary | ICD-10-CM | POA: Diagnosis not present

## 2020-02-15 DIAGNOSIS — K50819 Crohn's disease of both small and large intestine with unspecified complications: Secondary | ICD-10-CM | POA: Diagnosis not present

## 2020-02-20 DIAGNOSIS — J4520 Mild intermittent asthma, uncomplicated: Secondary | ICD-10-CM | POA: Diagnosis not present

## 2020-02-20 DIAGNOSIS — Z880 Allergy status to penicillin: Secondary | ICD-10-CM | POA: Diagnosis not present

## 2020-02-20 DIAGNOSIS — Z87892 Personal history of anaphylaxis: Secondary | ICD-10-CM | POA: Diagnosis not present

## 2020-02-20 DIAGNOSIS — J302 Other seasonal allergic rhinitis: Secondary | ICD-10-CM | POA: Diagnosis not present

## 2020-02-20 DIAGNOSIS — Z889 Allergy status to unspecified drugs, medicaments and biological substances status: Secondary | ICD-10-CM | POA: Diagnosis not present

## 2020-02-26 DIAGNOSIS — Z01419 Encounter for gynecological examination (general) (routine) without abnormal findings: Secondary | ICD-10-CM | POA: Diagnosis not present

## 2020-02-26 DIAGNOSIS — K5090 Crohn's disease, unspecified, without complications: Secondary | ICD-10-CM | POA: Diagnosis not present

## 2020-02-26 DIAGNOSIS — Z124 Encounter for screening for malignant neoplasm of cervix: Secondary | ICD-10-CM | POA: Diagnosis not present

## 2020-02-26 DIAGNOSIS — N83209 Unspecified ovarian cyst, unspecified side: Secondary | ICD-10-CM | POA: Diagnosis not present

## 2020-03-07 DIAGNOSIS — N6002 Solitary cyst of left breast: Secondary | ICD-10-CM | POA: Diagnosis not present

## 2020-03-07 DIAGNOSIS — R922 Inconclusive mammogram: Secondary | ICD-10-CM | POA: Diagnosis not present

## 2020-03-18 ENCOUNTER — Ambulatory Visit (INDEPENDENT_AMBULATORY_CARE_PROVIDER_SITE_OTHER): Payer: BC Managed Care – PPO | Admitting: Internal Medicine

## 2020-03-18 DIAGNOSIS — E538 Deficiency of other specified B group vitamins: Secondary | ICD-10-CM | POA: Diagnosis not present

## 2020-03-18 DIAGNOSIS — K50819 Crohn's disease of both small and large intestine with unspecified complications: Secondary | ICD-10-CM

## 2020-03-20 ENCOUNTER — Telehealth: Payer: Self-pay | Admitting: Internal Medicine

## 2020-03-20 NOTE — Telephone Encounter (Signed)
Patient called states she started a new job and is requesting letter signed by Dr. Hilarie Fredrickson to get exempted for the covid vaccine

## 2020-03-20 NOTE — Telephone Encounter (Signed)
Left message for pt that Dr. Hilarie Fredrickson will not write letters to excuse someone from the covid vaccine. Our office recommends pts get the vaccine. Pt should contact her PCP for this.

## 2020-03-21 IMAGING — US US PELVIS COMPLETE WITH TRANSVAGINAL
2 series · 13 of 25 positions shown · non-contrast
Comparison: MRI 06/16/2019

CLINICAL DATA: Right ovarian cyst

EXAM:
TRANSABDOMINAL AND TRANSVAGINAL ULTRASOUND OF PELVIS
TECHNIQUE: Both transabdominal and transvaginal ultrasound examinations of the
pelvis were performed. Transabdominal technique was performed for
global imaging of the pelvis including uterus, ovaries, adnexal
regions, and pelvic cul-de-sac. It was necessary to proceed with
endovaginal exam following the transabdominal exam to visualize the
uterus endometrium ovaries.

[Series 1: us pelvis complete with transvaginal · 0.23mm/px · 7 of 75 slices shown (1 of 2)]
[im 1/75]
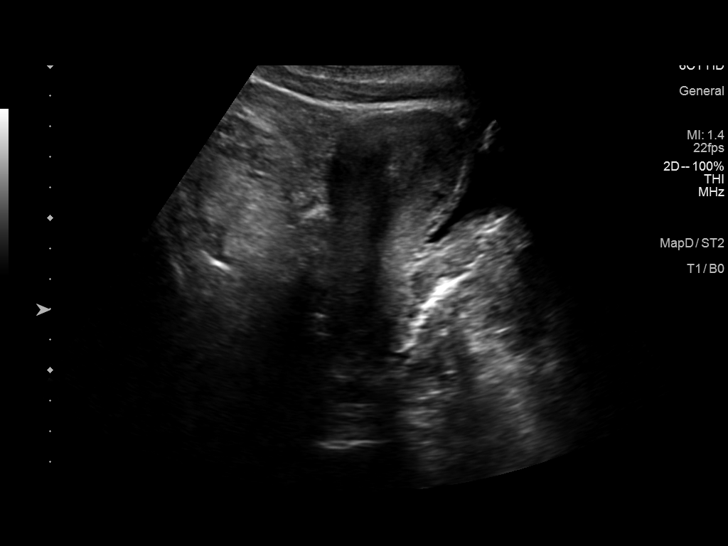
[im 12/75]
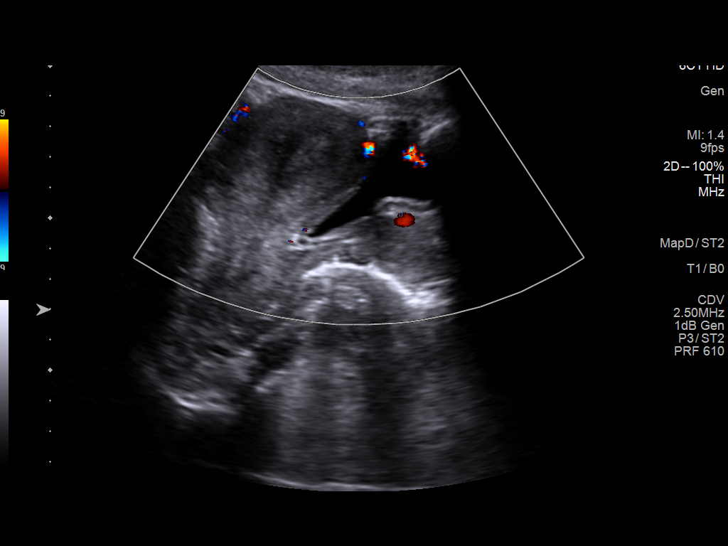
[im 23/75]
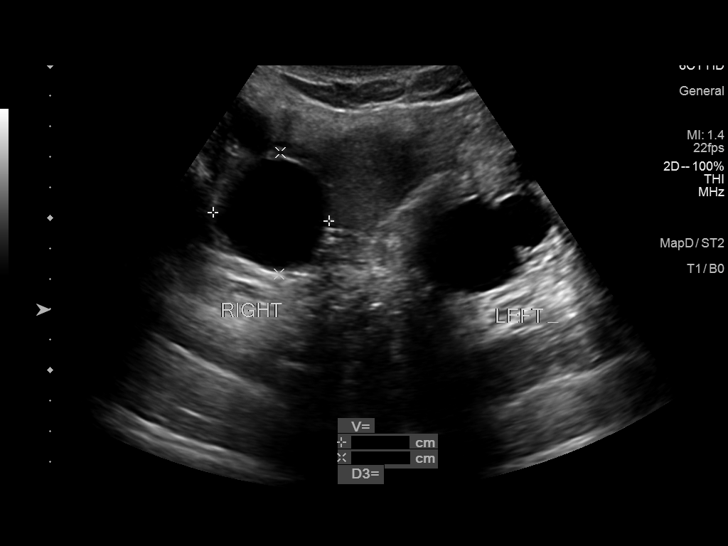
[im 35/75]
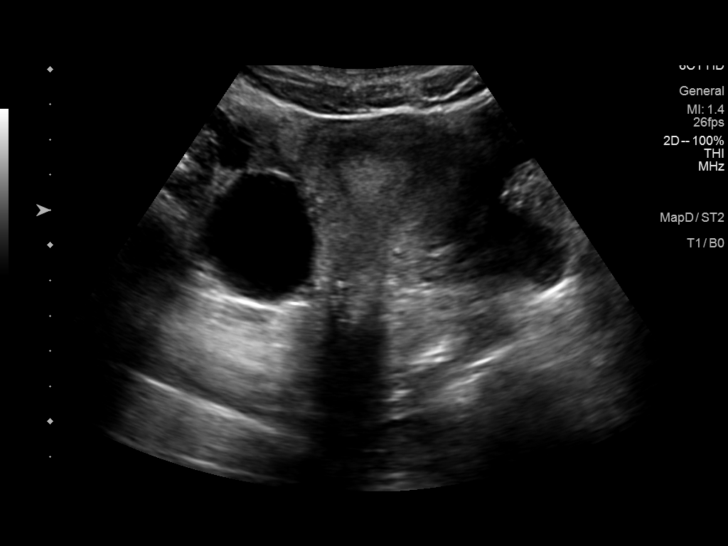
[im 46/75]
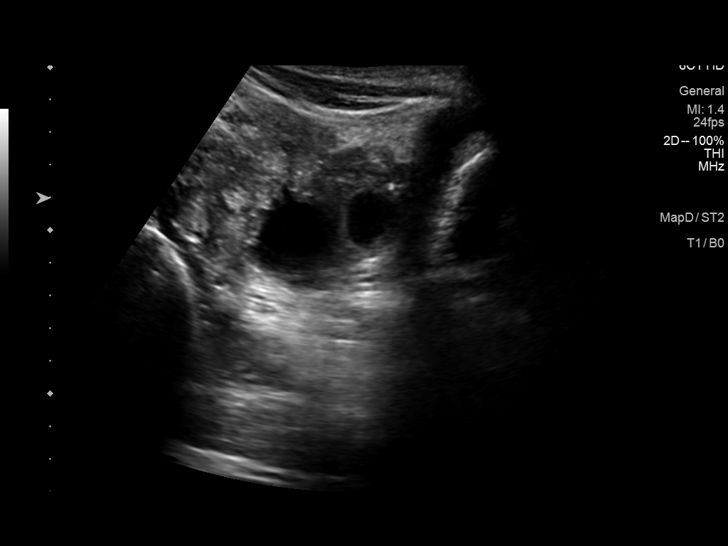
[im 57/75]
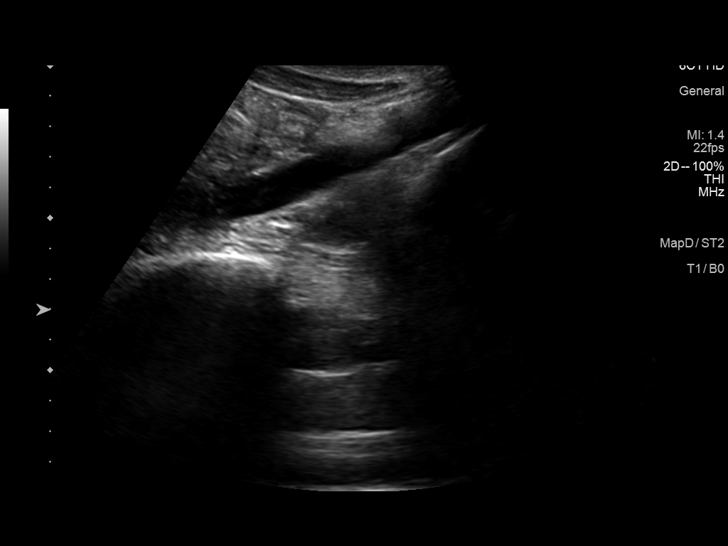
[im 69/75]
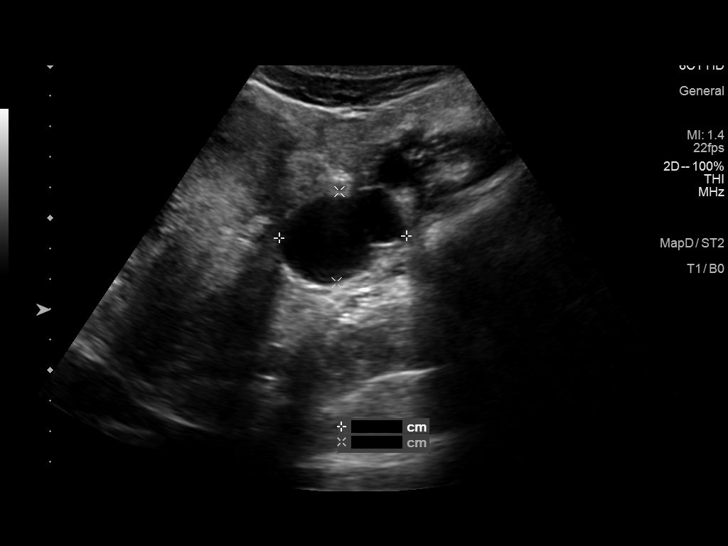

[Series 1001: us pelvis complete with transvaginal · 0.11mm/px · 6 of 63 slices shown (2 of 2)]
[im 1/63]
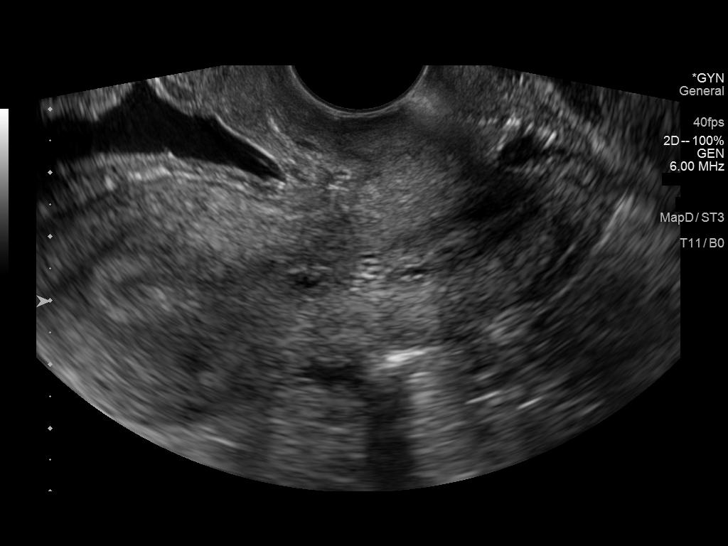
[im 13/63]
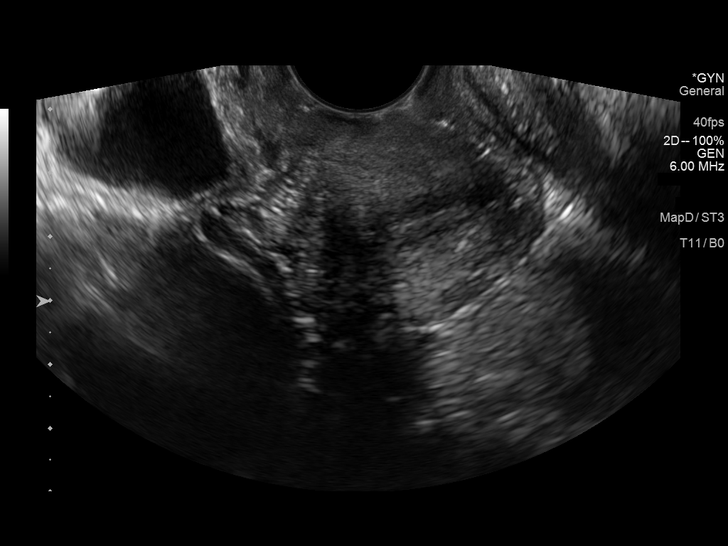
[im 25/63]
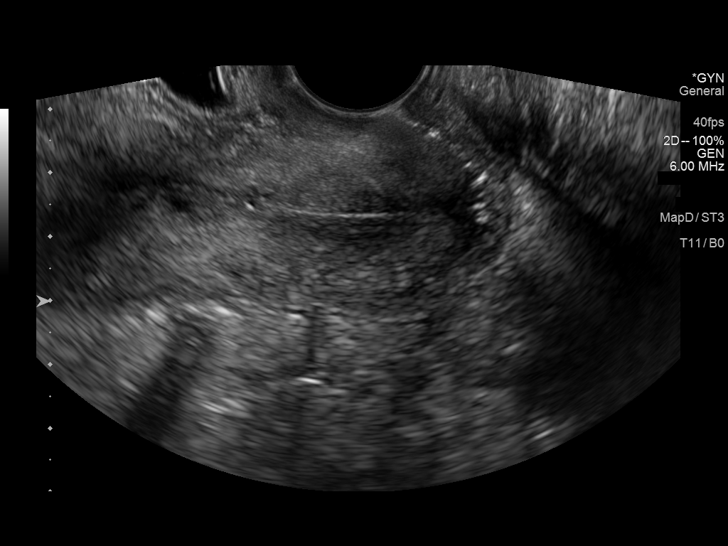
[im 38/63]
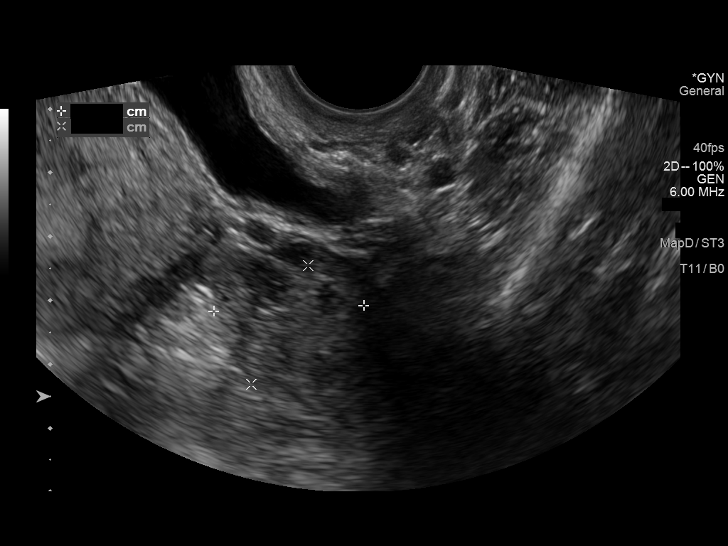
[im 50/63]
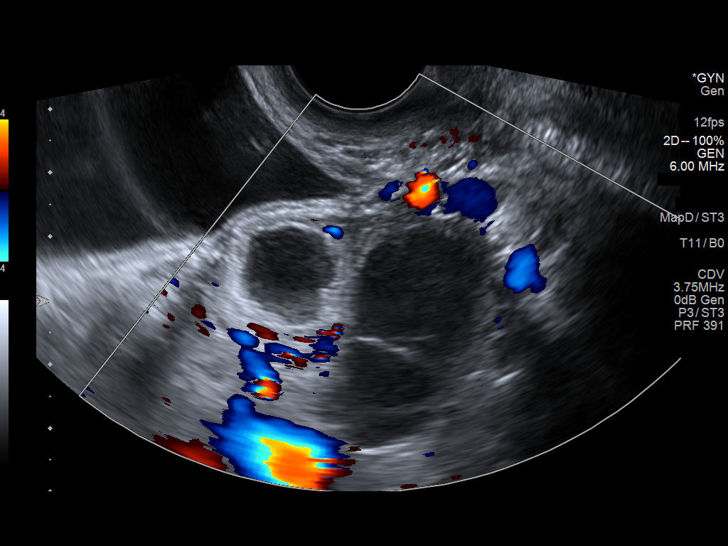
[im 63/63]
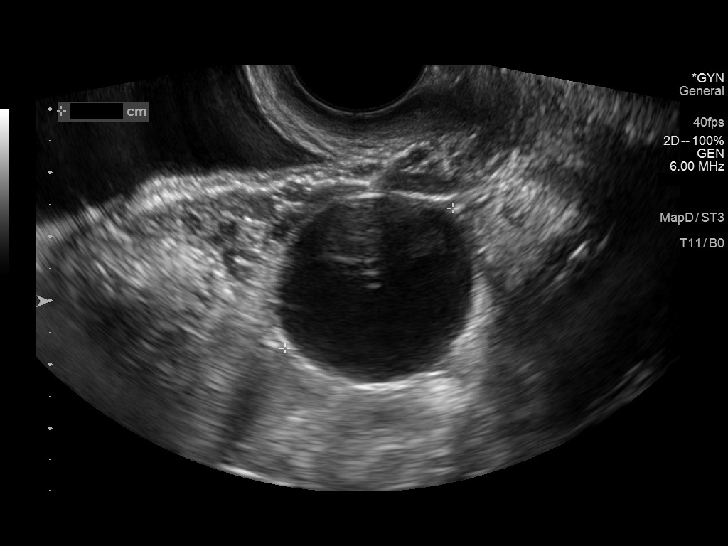

[13 of 25 positions shown; findings below may reference images not displayed]

FINDINGS: Uterus

Measurements: 8.7 x 4.4 x 5.3 cm = volume: 104 mL. Nabothian cysts
in the cervix. Questionable endovaginal mass on transabdominal views
measuring 2.1 x 2 x 2.3 cm, not seen on transvaginal views.

Endometrium

Thickness: 10 mm.  No focal abnormality visualized.

Right ovary

Measurements: 2.1 x 1.4 x 1.7 cm = volume: 2.7 mL. Sonolucent cyst
measuring 3.8 x 4 x 4.8 cm.

Left ovary

Measurements: 2.6 x 2.1 x 2.4 cm = volume: 0.8 mL. Cyst with single
thin septation measuring 4.3 x 3.8 x 3 cm. Hypoechoic cyst
containing internal echoes adjacent to this measuring 1.9 x 1.9 x
2.2 cm.

Other findings

Trace free fluid.
IMPRESSION: 1. 4.8 cm sonolucent right ovarian cyst. No imaging follow-up for
this finding is required.
2. Mildly complex cyst in the left ovary measuring 2.2 cm, possible
hemorrhagic cyst. Consider short interval pelvic ultrasound
follow-up in 6-12 weeks this finding.
3. Possible 2.3 cm endovaginal mass seen only on transabdominal
views. Recommend direct inspection.

## 2020-03-22 ENCOUNTER — Telehealth: Payer: Self-pay

## 2020-03-22 NOTE — Telephone Encounter (Signed)
Left message for pt to call back  °

## 2020-03-22 NOTE — Telephone Encounter (Signed)
-----   Message from Algernon Huxley, RN sent at 12/19/2019  4:25 PM EDT ----- Regarding: Labs Pt needs labs, orders in epic

## 2020-03-25 NOTE — Telephone Encounter (Signed)
Informed pt of lab orders, pt voiced understanding

## 2020-06-04 ENCOUNTER — Ambulatory Visit (INDEPENDENT_AMBULATORY_CARE_PROVIDER_SITE_OTHER): Payer: BC Managed Care – PPO | Admitting: Internal Medicine

## 2020-06-04 DIAGNOSIS — K50819 Crohn's disease of both small and large intestine with unspecified complications: Secondary | ICD-10-CM

## 2020-06-04 DIAGNOSIS — E538 Deficiency of other specified B group vitamins: Secondary | ICD-10-CM | POA: Diagnosis not present

## 2020-06-04 NOTE — Progress Notes (Signed)
Pt presents today for nurse visit for an injection. Per Dr. Hilarie Fredrickson orders, injection of Cyanocobalamin 1000 mcg/mL (56m vial), 1cc given IM in R deltoid today by A. Kamiryn Bezanson, LPN. Denies any adverse reactions or discomfort.

## 2020-06-04 NOTE — Patient Instructions (Signed)

## 2020-07-09 ENCOUNTER — Other Ambulatory Visit: Payer: Self-pay | Admitting: Internal Medicine

## 2020-07-22 ENCOUNTER — Ambulatory Visit (INDEPENDENT_AMBULATORY_CARE_PROVIDER_SITE_OTHER): Payer: BC Managed Care – PPO | Admitting: Internal Medicine

## 2020-07-22 DIAGNOSIS — E538 Deficiency of other specified B group vitamins: Secondary | ICD-10-CM

## 2020-07-22 DIAGNOSIS — K50819 Crohn's disease of both small and large intestine with unspecified complications: Secondary | ICD-10-CM

## 2020-07-31 DIAGNOSIS — L700 Acne vulgaris: Secondary | ICD-10-CM | POA: Diagnosis not present

## 2020-08-29 ENCOUNTER — Telehealth: Payer: Self-pay | Admitting: Internal Medicine

## 2020-08-29 NOTE — Telephone Encounter (Signed)
Spoke with pt and she wants to know if Dr. Hilarie Fredrickson will check her vit B and vit D level. Please advise.

## 2020-08-29 NOTE — Telephone Encounter (Signed)
Patient called in to schedule a B12 for 09/02/20. Also had a question about labs being ordered every couple months and asked if the next time if can add Vitamin D tjust to check her levels.

## 2020-09-02 ENCOUNTER — Ambulatory Visit (INDEPENDENT_AMBULATORY_CARE_PROVIDER_SITE_OTHER): Payer: BC Managed Care – PPO | Admitting: Internal Medicine

## 2020-09-02 ENCOUNTER — Other Ambulatory Visit (INDEPENDENT_AMBULATORY_CARE_PROVIDER_SITE_OTHER): Payer: BC Managed Care – PPO

## 2020-09-02 ENCOUNTER — Other Ambulatory Visit: Payer: Self-pay

## 2020-09-02 DIAGNOSIS — D509 Iron deficiency anemia, unspecified: Secondary | ICD-10-CM | POA: Diagnosis not present

## 2020-09-02 DIAGNOSIS — K50819 Crohn's disease of both small and large intestine with unspecified complications: Secondary | ICD-10-CM

## 2020-09-02 DIAGNOSIS — E538 Deficiency of other specified B group vitamins: Secondary | ICD-10-CM

## 2020-09-02 LAB — CBC WITH DIFFERENTIAL/PLATELET
Basophils Absolute: 0 10*3/uL (ref 0.0–0.1)
Basophils Relative: 0.6 % (ref 0.0–3.0)
Eosinophils Absolute: 0.2 10*3/uL (ref 0.0–0.7)
Eosinophils Relative: 3.5 % (ref 0.0–5.0)
HCT: 39 % (ref 36.0–46.0)
Hemoglobin: 13.2 g/dL (ref 12.0–15.0)
Lymphocytes Relative: 36.9 % (ref 12.0–46.0)
Lymphs Abs: 2 10*3/uL (ref 0.7–4.0)
MCHC: 33.8 g/dL (ref 30.0–36.0)
MCV: 91.6 fl (ref 78.0–100.0)
Monocytes Absolute: 0.4 10*3/uL (ref 0.1–1.0)
Monocytes Relative: 8.2 % (ref 3.0–12.0)
Neutro Abs: 2.8 10*3/uL (ref 1.4–7.7)
Neutrophils Relative %: 50.8 % (ref 43.0–77.0)
Platelets: 201 10*3/uL (ref 150.0–400.0)
RBC: 4.25 Mil/uL (ref 3.87–5.11)
RDW: 13.1 % (ref 11.5–15.5)
WBC: 5.4 10*3/uL (ref 4.0–10.5)

## 2020-09-02 LAB — VITAMIN D 25 HYDROXY (VIT D DEFICIENCY, FRACTURES): VITD: 18.77 ng/mL — ABNORMAL LOW (ref 30.00–100.00)

## 2020-09-02 LAB — VITAMIN B12: Vitamin B-12: >1506 pg/mL — ABNORMAL HIGH (ref 211–911)

## 2020-09-02 NOTE — Telephone Encounter (Signed)
Yes please check the following CBC, IBC + ferritin, B12, Vit D3 Thanks JMP

## 2020-09-02 NOTE — Telephone Encounter (Signed)
Pt aware, orders in epic.

## 2020-09-03 LAB — IBC + FERRITIN
Ferritin: 32.7 ng/mL (ref 10.0–291.0)
Iron: 38 ug/dL — ABNORMAL LOW (ref 42–145)
Saturation Ratios: 14.6 % — ABNORMAL LOW (ref 20.0–50.0)
Transferrin: 186 mg/dL — ABNORMAL LOW (ref 212.0–360.0)

## 2020-09-06 ENCOUNTER — Other Ambulatory Visit: Payer: Self-pay

## 2020-09-06 DIAGNOSIS — E559 Vitamin D deficiency, unspecified: Secondary | ICD-10-CM

## 2020-09-06 MED ORDER — VITAMIN D (ERGOCALCIFEROL) 1.25 MG (50000 UNIT) PO CAPS: 50000.0000 [IU] | ORAL_CAPSULE | ORAL | 0 refills | Status: DC

## 2020-10-03 ENCOUNTER — Ambulatory Visit (INDEPENDENT_AMBULATORY_CARE_PROVIDER_SITE_OTHER): Payer: BC Managed Care – PPO | Admitting: Internal Medicine

## 2020-10-03 DIAGNOSIS — E538 Deficiency of other specified B group vitamins: Secondary | ICD-10-CM

## 2020-11-01 ENCOUNTER — Other Ambulatory Visit: Payer: Self-pay | Admitting: Internal Medicine

## 2020-11-05 ENCOUNTER — Ambulatory Visit (INDEPENDENT_AMBULATORY_CARE_PROVIDER_SITE_OTHER): Payer: BC Managed Care – PPO | Admitting: Internal Medicine

## 2020-11-05 DIAGNOSIS — E538 Deficiency of other specified B group vitamins: Secondary | ICD-10-CM

## 2020-11-18 DIAGNOSIS — B373 Candidiasis of vulva and vagina: Secondary | ICD-10-CM | POA: Diagnosis not present

## 2020-11-22 ENCOUNTER — Encounter: Payer: Self-pay | Admitting: *Deleted

## 2020-12-05 ENCOUNTER — Ambulatory Visit (INDEPENDENT_AMBULATORY_CARE_PROVIDER_SITE_OTHER): Payer: BC Managed Care – PPO | Admitting: Internal Medicine

## 2020-12-05 DIAGNOSIS — E538 Deficiency of other specified B group vitamins: Secondary | ICD-10-CM

## 2020-12-05 DIAGNOSIS — K50819 Crohn's disease of both small and large intestine with unspecified complications: Secondary | ICD-10-CM | POA: Diagnosis not present

## 2020-12-30 ENCOUNTER — Ambulatory Visit: Payer: BC Managed Care – PPO | Admitting: Internal Medicine

## 2021-01-06 ENCOUNTER — Telehealth: Payer: Self-pay

## 2021-01-06 NOTE — Telephone Encounter (Signed)
Pt notified of Lab that needs to be collected. Pt stated that she is unable to get any labs done at this time due to the fact that she has no insurance and stated that as soon as she gets a job she will resume care.

## 2021-01-06 NOTE — Telephone Encounter (Signed)
-----   Message from Algernon Huxley, RN sent at 09/06/2020  9:54 AM EDT ----- Regarding: Lab Pt needs Vit d lab order in epic

## 2022-05-05 ENCOUNTER — Ambulatory Visit: Payer: BC Managed Care – PPO | Admitting: Internal Medicine

## 2022-05-05 ENCOUNTER — Other Ambulatory Visit (INDEPENDENT_AMBULATORY_CARE_PROVIDER_SITE_OTHER): Payer: BC Managed Care – PPO

## 2022-05-05 ENCOUNTER — Encounter: Payer: Self-pay | Admitting: Internal Medicine

## 2022-05-05 VITALS — BP 120/78 | HR 82 | Ht 62.0 in | Wt 110.0 lb

## 2022-05-05 DIAGNOSIS — E538 Deficiency of other specified B group vitamins: Secondary | ICD-10-CM

## 2022-05-05 DIAGNOSIS — E559 Vitamin D deficiency, unspecified: Secondary | ICD-10-CM | POA: Diagnosis not present

## 2022-05-05 DIAGNOSIS — K50819 Crohn's disease of both small and large intestine with unspecified complications: Secondary | ICD-10-CM | POA: Diagnosis not present

## 2022-05-05 DIAGNOSIS — K5080 Crohn's disease of both small and large intestine without complications: Secondary | ICD-10-CM | POA: Diagnosis not present

## 2022-05-05 LAB — COMPREHENSIVE METABOLIC PANEL
ALT: 9 U/L (ref 0–35)
AST: 17 U/L (ref 0–37)
Albumin: 3.9 g/dL (ref 3.5–5.2)
Alkaline Phosphatase: 47 U/L (ref 39–117)
BUN: 7 mg/dL (ref 6–23)
CO2: 29 mEq/L (ref 19–32)
Calcium: 9.3 mg/dL (ref 8.4–10.5)
Chloride: 110 mEq/L (ref 96–112)
Creatinine, Ser: 0.82 mg/dL (ref 0.40–1.20)
GFR: 90.95 mL/min (ref >60.00)
Glucose, Bld: 88 mg/dL (ref 70–99)
Potassium: 3.8 mEq/L (ref 3.5–5.1)
Sodium: 146 mEq/L — ABNORMAL HIGH (ref 135–145)
Total Bilirubin: 0.4 mg/dL (ref 0.2–1.2)
Total Protein: 6.5 g/dL (ref 6.0–8.3)

## 2022-05-05 LAB — CBC WITH DIFFERENTIAL/PLATELET
Basophils Absolute: 0.1 10*3/uL (ref 0.0–0.1)
Basophils Relative: 1.2 % (ref 0.0–3.0)
Eosinophils Absolute: 0.2 10*3/uL (ref 0.0–0.7)
Eosinophils Relative: 4.2 % (ref 0.0–5.0)
HCT: 39 % (ref 36.0–46.0)
Hemoglobin: 13 g/dL (ref 12.0–15.0)
Lymphocytes Relative: 41.9 % (ref 12.0–46.0)
Lymphs Abs: 2.4 10*3/uL (ref 0.7–4.0)
MCHC: 33.5 g/dL (ref 30.0–36.0)
MCV: 92.9 fl (ref 78.0–100.0)
Monocytes Absolute: 0.5 10*3/uL (ref 0.1–1.0)
Monocytes Relative: 9.4 % (ref 3.0–12.0)
Neutro Abs: 2.5 10*3/uL (ref 1.4–7.7)
Neutrophils Relative %: 43.3 % (ref 43.0–77.0)
Platelets: 226 10*3/uL (ref 150.0–400.0)
RBC: 4.2 Mil/uL (ref 3.87–5.11)
RDW: 14.5 % (ref 11.5–15.5)
WBC: 5.7 10*3/uL (ref 4.0–10.5)

## 2022-05-05 LAB — VITAMIN B12: Vitamin B-12: 236 pg/mL (ref 211–911)

## 2022-05-05 LAB — HIGH SENSITIVITY CRP: CRP, High Sensitivity: 0.41 mg/L (ref 0.000–5.000)

## 2022-05-05 NOTE — Patient Instructions (Signed)
Your provider has requested that you go to the basement level for lab work before leaving today. Press "B" on the elevator. The lab is located at the first door on the left as you exit the elevator.  If you are age 38 or older, your body mass index should be between 23-30. Your Body mass index is 20.12 kg/m. If this is out of the aforementioned range listed, please consider follow up with your Primary Care Provider.  If you are age 65 or younger, your body mass index should be between 19-25. Your Body mass index is 20.12 kg/m. If this is out of the aformentioned range listed, please consider follow up with your Primary Care Provider.   ________________________________________________________  The [facility] providers would like to encourage you to use [facility] to communicate with providers for non-urgent requests or questions.  Due to long hold times on the telephone, sending your provider a message by [facility] may be a faster and more efficient way to get a response.  Please allow 48 business hours for a response.  Please remember that this is for non-urgent requests.   Due to recent changes in healthcare laws, you may see the results of your imaging and laboratory studies on MyChart before your provider has had a chance to review them.  We understand that in some cases there may be results that are confusing or concerning to you. Not all laboratory results come back in the same time frame and the provider may be waiting for multiple results in order to interpret others.  Please give [place] 48 hours in order for your provider to thoroughly review all the results before contacting the office for clarification of your results.

## 2022-05-05 NOTE — Progress Notes (Signed)
Subjective:    Patient ID: Brittany Le, female    DOB: 1986-02-04, 36 y.o.   MRN: [id number]  HPI Brittany Le is a 36 year old female with a history of ileocolonic Crohn's disease diagnosed in 2012, history of allergic reaction to Humira causing severe dermatitis (there was question of a spongiotic dermatitis) treated with methotrexate for quite some time now off of all immunomodulator therapy, B12 and Vit D def who returns for follow-up.  She was last seen in January 2021.  She is here alone today.  After her last visit she had a normal fecal calprotectin and normal MR enterography.  Surprisingly despite absence of therapy there is been no evidence of recurrent Crohn's ileocolitis.  Fortunately she has continued to feel very well.  She has no abdominal pain, diarrhea, blood or mucus in stool.  No upper GI or hepatobiliary complaint.  No ocular complaint, oral ulcers or new joint pains.  She has a new job with the [employer] though did lose her insurance briefly.  This caused her to be off of her B12 injections which did dramatically improve her fatigue.  She is now feeling some slight fatigue.  Unfortunately she had a miscarriage around 12 weeks earlier this year in February and required a chemical abortion.  She is in a long-term relationship and hopes to be married and is thinking about having children.   Review of Systems As per HPI, otherwise negative  Current Medications, Allergies, Past Medical History, Past Surgical History, Family History and Social History were reviewed in [employer] record.\    Objective:   Physical Exam BP 120/78   Pulse 82   Ht 5' 2"  (1.575 m)   Wt 110 lb (49.9 kg)   SpO2 99%   BMI 20.12 kg/m  Gen: awake, alert, NAD HEENT: anicteric  CV: RRR, no mrg Pulm: CTA b/l Abd: soft, NT/ND, +BS throughout Ext: no c/c/e Neuro: nonfocal      MR ABDOMEN AND PELVIS WITHOUT AND WITH CONTRAST (MR ENTEROGRAPHY)    TECHNIQUE: Multiplanar, multisequence MRI of the abdomen and pelvis was performed both before and during bolus administration of intravenous contrast. Negative oral contrast VoLumen was given.   CONTRAST:  86m GADAVIST GADOBUTROL 1 MMOL/ML IV SOLN   COMPARISON:  None.   FINDINGS: COMBINED FINDINGS FOR BOTH MR ABDOMEN AND PELVIS   Lower chest: Unremarkable   Hepatobiliary: Contracted gallbladder. There is potentially some sludge in the gallbladder based on the T2 weighted images, but no definite gallstones. The liver appears otherwise normal.   Pancreas:  Unremarkable   Spleen:  Unremarkable   Adrenals/Urinary Tract: Both adrenal glands appear normal. The kidneys likewise appear normal. Urinary bladder empty during imaging.   Stomach/Bowel: Normal small bowel folds in caliber. Borderline prominence of gas and stool in the colon. No abnormal accentuated mucosal enhancement or mural thickening in the small or large bowel is identified. Subtraction images reveal the terminal ileum and adjacent cecum to be unremarkable. No abscess or other specific findings of concern. Overall I do not detect signs of active Crohn's disease.   Vascular/Lymphatic:  Unremarkable.  No adenopathy identified.   Reproductive: Small cystic lesions along the adnexa bilaterally, 4.0 by 2.9 cm on the right and 3.8 by 2.9 cm on the left, possibilities include ovarian cysts or hydrosalpinges. No worrisome enhancement. Endometrium unremarkable.   Other:  No supplemental non-categorized findings.   Musculoskeletal: The sacroiliac joints appear normal. No significant abnormality is identified.   IMPRESSION: 1. No findings  of active Crohn's disease. 2. Cystic lesions along the adnexa bilaterally, either ovarian cyst or hydrosalpinges. 3. Potential small amount of sludge in the gallbladder. No gallstones are observed.     Electronically Signed   By: Van Clines M.D.   On: 06/16/2019  15:58 Assessment & Plan:  36 year old female with a history of ileocolonic Crohn's disease diagnosed in 2012, history of allergic reaction to Humira causing severe dermatitis (there was question of a spongiotic dermatitis) treated with methotrexate for quite some time now off of all immunomodulator therapy, B12 and Vit D def who returns for follow-up.   Ileocolonic Crohn's disease (dx Dec 2012 with granulomas by biopsy) --Humira treatment initially for which she developed a possible reaction versus spongiotic dermatitis.  Humira stopped and she took methotrexate for some time.  In 2021 she had no clinical or radiographic evidence of disease.  Her fecal calprotectin was also normal at that time.  While this is atypical for Crohn's disease it appears that she has been able to maintain remission on no therapy.  This appears to still be the case.  We did discuss that should she become pregnant it should not "cause" Crohn's disease but IBD can flare and around pregnancy.  I do not think this should prohibit her from trying to conceive. -- CBC, CMP, CRP and fecal calprotectin -- If fecal calprotectin is elevated I would recommend proceeding with colonoscopy -- She should let me know if she has any recurrent abdominal symptoms including pain, diarrhea, blood or mucus in stool  2.  B12 deficiency --repeat B12 level, if low resume IM monthly injection therapy  3.  Vitamin D deficiency --previously supplemented, repeat vitamin D level today  4.  Colon cancer screening --screening colonoscopy recommended at age 21.  Her Crohn's colitis was patchy and been in remission now for some time and so colonoscopic surveillance deferred unless clinical symptoms or inflammatory markers elevated.  30 minutes total spent today including patient facing time, coordination of care, reviewing medical history/procedures/pertinent radiology studies, and documentation of the encounter.

## 2022-05-08 LAB — VITAMIN D 1,25 DIHYDROXY
Vitamin D 1, 25 (OH)2 Total: 55 pg/mL (ref 18–72)
Vitamin D2 1, 25 (OH)2: 22 pg/mL
Vitamin D3 1, 25 (OH)2: 33 pg/mL

## 2022-05-14 ENCOUNTER — Other Ambulatory Visit: Payer: Self-pay

## 2022-05-14 MED ORDER — CYANOCOBALAMIN 1000 MCG/ML IJ SOLN: 1000.0000 ug | INTRAMUSCULAR | 6 refills | Status: DC

## 2022-05-15 ENCOUNTER — Ambulatory Visit
Admission: EM | Admit: 2022-05-15 | Discharge: 2022-05-15 | Disposition: A | Discharge status: Home / Self Care | Payer: BC Managed Care – PPO | Attending: Urgent Care | Admitting: Urgent Care

## 2022-05-15 DIAGNOSIS — N760 Acute vaginitis: Secondary | ICD-10-CM | POA: Diagnosis not present

## 2022-05-15 DIAGNOSIS — B3731 Acute candidiasis of vulva and vagina: Secondary | ICD-10-CM | POA: Diagnosis not present

## 2022-05-15 DIAGNOSIS — B9689 Other specified bacterial agents as the cause of diseases classified elsewhere: Secondary | ICD-10-CM | POA: Diagnosis not present

## 2022-05-15 DIAGNOSIS — Z113 Encounter for screening for infections with a predominantly sexual mode of transmission: Secondary | ICD-10-CM | POA: Insufficient documentation

## 2022-05-15 MED ORDER — METRONIDAZOLE 500 MG PO TABS: 500.0000 mg | ORAL_TABLET | Freq: Two times a day (BID) | ORAL | 0 refills | Status: DC

## 2022-05-15 MED ORDER — FLUCONAZOLE 150 MG PO TABS: 150.0000 mg | ORAL_TABLET | ORAL | 0 refills | Status: DC

## 2022-05-15 NOTE — ED Triage Notes (Signed)
Pt c/o vaginal foul smelling since March/April-denies discharge-NAD-steady gait

## 2022-05-15 NOTE — ED Provider Notes (Signed)
[facility]  Note:  This document was prepared using Systems analyst and may include unintentional dictation errors.  MRN: [id number] DOB: 10/03/1985  Subjective:   Brittany Le is a 37 y.o. female presenting for 72-month history of persistent intermittent malodorous vaginal discharge.  Denies fever, n/v, abdominal pain, pelvic pain, rashes, dysuria, urinary frequency, hematuria. Has a history of BV, yeast vaginitis.  She has 1 partner, does not use protection.  She has previously been checked for STIs and has been negative.  Would like to be checked again just for good measure.  No current facility-administered medications for this encounter.  Current Outpatient Medications:    cyanocobalamin (VITAMIN B12) 1000 MCG/ML injection, Inject 1 mL (1,000 mcg total) into the muscle every 30 (thirty) days., Disp: 1 mL, Rfl: 6   Allergies  Allergen Reactions   Humira [Adalimumab] Other (See Comments)    Pt had Plantar/Palmar Pustular Psoriasis reaction per dermatologist.   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   Drug use: No    ROS   Objective:   Vitals: BP  (!) 147/92 (BP Location: Right Arm)   Pulse (!) 111   Temp 98.2 F (36.8 C) (Oral)   Resp 16   LMP 04/22/2022   SpO2 96%   Physical Exam Constitutional:      General: She is not in acute distress.    Appearance: Normal appearance. She is well-developed. She is not ill-appearing, toxic-appearing or diaphoretic.  HENT:     Head: Normocephalic and atraumatic.     Nose: Nose normal.     Mouth/Throat:     Mouth: Mucous membranes are moist.  Eyes:     General: No scleral icterus.       Right eye: No discharge.        Left eye: No discharge.     Extraocular Movements: Extraocular movements intact.     Conjunctiva/sclera: Conjunctivae normal.  Cardiovascular:     Rate and Rhythm: Normal rate.  Pulmonary:     Effort: Pulmonary effort is normal.  Abdominal:     General: Bowel sounds are normal. There is no distension.     Palpations: Abdomen is soft. There is no mass.     Tenderness: There is no abdominal tenderness. There is no right CVA tenderness, left CVA tenderness, guarding or rebound.  Skin:    General: Skin is warm and dry.  Neurological:     General: No focal deficit present.     Mental Status: She is alert and oriented to person, place, and time.  Psychiatric:        Mood and Affect: Mood normal.        Behavior: Behavior normal.        Thought Content: Thought content normal.        Judgment: Judgment normal.     Assessment and Plan :   PDMP not reviewed this encounter.  1. Bacterial vaginosis   2. Yeast vaginitis     We will treat empirically for bacterial vaginosis and concurrent yeast infection with metronidazole and fluconazole.  Labs pending, will treat as appropriate otherwise. Counseled patient on potential for adverse effects with medications prescribed/recommended today, ER and return-to-clinic precautions discussed, patient verbalized understanding.    Brittany Le, [place] 05/15/22 [id number]

## 2022-05-18 LAB — CERVICOVAGINAL ANCILLARY ONLY
Bacterial Vaginitis (gardnerella): POSITIVE — AB
Candida Glabrata: NEGATIVE
Candida Vaginitis: NEGATIVE
Chlamydia: NEGATIVE
Comment: NEGATIVE
Comment: NEGATIVE
Comment: NEGATIVE
Comment: NEGATIVE
Comment: NEGATIVE
Comment: NORMAL
Neisseria Gonorrhea: NEGATIVE
Trichomonas: NEGATIVE

## 2022-05-21 ENCOUNTER — Ambulatory Visit (INDEPENDENT_AMBULATORY_CARE_PROVIDER_SITE_OTHER): Payer: BC Managed Care – PPO | Admitting: Internal Medicine

## 2022-05-21 VITALS — Ht 62.0 in

## 2022-05-21 DIAGNOSIS — E538 Deficiency of other specified B group vitamins: Secondary | ICD-10-CM

## 2022-05-21 MED ORDER — CYANOCOBALAMIN 1000 MCG/ML IJ SOLN
1000.0000 ug | Freq: Once | INTRAMUSCULAR | Status: AC
  Administered 2022-05-21: 1000 ug via INTRAMUSCULAR

## 2022-06-11 ENCOUNTER — Ambulatory Visit
Admission: EM | Admit: 2022-06-11 | Discharge: 2022-06-11 | Disposition: A | Discharge status: Home / Self Care | Payer: BC Managed Care – PPO | Attending: Nurse Practitioner | Admitting: Nurse Practitioner

## 2022-06-11 DIAGNOSIS — N760 Acute vaginitis: Secondary | ICD-10-CM | POA: Diagnosis not present

## 2022-06-11 MED ORDER — CLINDAMYCIN HCL 300 MG PO CAPS: 300.0000 mg | ORAL_CAPSULE | Freq: Two times a day (BID) | ORAL | 0 refills | Status: AC

## 2022-06-11 MED ORDER — FLUCONAZOLE 150 MG PO TABS: 150.0000 mg | ORAL_TABLET | Freq: Every day | ORAL | 0 refills | Status: DC

## 2022-06-11 NOTE — ED Triage Notes (Signed)
Patient c/o vaginal odor that began Monday.

## 2022-06-11 NOTE — Discharge Instructions (Signed)
Clindamycin twice daily for 7 days Diflucan as prescribed The clinical contact you with results of your vaginal swab if it is positive Rest and fluids Follow-up with your gynecologist or PCP if symptoms do not improve Please go to the emergency room for any worsening symptoms

## 2022-06-11 NOTE — ED Provider Notes (Signed)
[facility]    CSN: [id number] Arrival date & time: 06/11/22  1907      History   Chief Complaint Chief Complaint  Patient presents with   vaginal odor    HPI Brittany Le is a 37 y.o. female presents for evaluation of vaginitis.  Patient states on 1/5 she was treated with metronidazole and fluconazole for BV.  Symptoms improved but did not completely resolve and over the past couple days she has noticed an increase in odor and discharge.  She denies any dysuria, fevers, chills, STD exposure or concern.  She did have negative STD testing done on 1/5.  She reports a history of BV infections in the past.  No other concerns at this time.  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      Home Medications    Prior to Admission medications   Medication Sig Start Date End Date Taking? Authorizing Provider  clindamycin (CLEOCIN) 300 MG capsule Take 1 capsule (300 mg total) by mouth in the morning and at bedtime for 7 days. 06/11/22 06/18/22 Yes Melynda Ripple, NP  fluconazole (DIFLUCAN) 150 MG tablet Take 1 tablet (150 mg total) by mouth daily. Take 1 tablet by mouth once and may repeat in 3 days if symptoms persist 06/11/22  Yes Melynda Ripple, NP  cyanocobalamin (VITAMIN B12) 1000 MCG/ML injection Inject 1 mL (1,000 mcg total) into the muscle every 30 (thirty) days. 05/14/22   Pyrtle, Lajuan Lines, MD    Family History Family  History  Problem Relation Age of Onset   Heart disease Mother    Diabetes Maternal Grandmother    Diabetes Paternal Grandmother    Hypertension Other        Both sides   Colon cancer Neg Hx     Social History Social History   Tobacco Use   Smoking status: Never   Smokeless tobacco: Never  Vaping Use   Vaping Use: Never used  Substance Use Topics   Alcohol use: Yes    Comment: occ.   Drug use: No     Allergies   Humira [adalimumab] and Procaine hcl   Review of Systems Review of Systems  Genitourinary:  Positive for vaginal discharge.     Physical Exam Triage Vital Signs ED Triage Vitals  Enc Vitals Group     BP 06/11/22 1914 102/67     Pulse Rate 06/11/22 1914 (!) 105     Resp 06/11/22 1914 16     Temp 06/11/22 1914 98 F (36.7 C)     Temp Source 06/11/22 1914 Oral     SpO2 06/11/22 1914 97 %     Weight --      Height --      Head Circumference --  Peak Flow --      Pain Score 06/11/22 1913 0     Pain Loc --      Pain Edu? --      Excl. in [place]? --    No data found.  Updated Vital Signs BP 102/67 (BP Location: Left Arm)   Pulse (!) 105   Temp 98 F (36.7 C) (Oral)   Resp 16   LMP 05/25/2022   SpO2 97%   Visual Acuity Right Eye Distance:   Left Eye Distance:   Bilateral Distance:    Right Eye Near:   Left Eye Near:    Bilateral Near:     Physical Exam Vitals and nursing note reviewed.  Constitutional:      Appearance: Normal appearance.  HENT:     Head: Normocephalic and atraumatic.  Eyes:     Pupils: Pupils are equal, round, and reactive to light.  Cardiovascular:     Rate and Rhythm: Normal rate.  Pulmonary:     Effort: Pulmonary effort is normal.  Skin:    General: Skin is warm and dry.  Neurological:     General: No focal deficit present.     Mental Status: She is alert and oriented to person, place, and time.  Psychiatric:        Mood and Affect: Mood normal.        Behavior: Behavior normal.      UC Treatments /  Results  Labs (all labs ordered are listed, but only abnormal results are displayed) Labs Reviewed  CERVICOVAGINAL ANCILLARY ONLY    EKG   Radiology No results found.  Procedures Procedures (including critical care time)  Medications Ordered in UC Medications - No data to display  Initial Impression / Assessment and Plan / UC Course  I have reviewed the triage vital signs and the nursing notes.  Pertinent labs & imaging results that were available during my care of the patient were reviewed by me and considered in my medical decision making (see chart for details).     Reviewed exam and symptoms with patient.  No red flags on exam. Vaginal swab is ordered and will contact for any positive results Given failed treatment of metronidazole start clindamycin.  Patient prefers oral medication and not vaginal gel.  Side effect profile reviewed.  Advise probiotic OTC Will do 2 more rounds of Diflucan Rest and fluids Advised to follow-up with PCP or GYN if symptoms do not improve ER precautions reviewed and patient verbalized understanding Final Clinical Impressions(s) / UC Diagnoses   Final diagnoses:  Acute vaginitis     Discharge Instructions      Clindamycin twice daily for 7 days Diflucan as prescribed The clinical contact you with results of your vaginal swab if it is positive Rest and fluids Follow-up with your gynecologist or PCP if symptoms do not improve Please go to the emergency room for any worsening symptoms   ED Prescriptions     Medication Sig Dispense Auth. Provider   fluconazole (DIFLUCAN) 150 MG tablet Take 1 tablet (150 mg total) by mouth daily. Take 1 tablet by mouth once and may repeat in 3 days if symptoms persist 2 tablet Melynda Ripple, NP   clindamycin (CLEOCIN) 300 MG capsule Take 1 capsule (300 mg total) by mouth in the morning and at bedtime for 7 days. 14 capsule Melynda Ripple, NP      PDMP not reviewed this encounter.   Melynda Ripple,  NP 06/11/22 1929

## 2022-06-15 LAB — CERVICOVAGINAL ANCILLARY ONLY
Bacterial Vaginitis (gardnerella): POSITIVE — AB
Candida Glabrata: NEGATIVE
Candida Vaginitis: NEGATIVE
Comment: NEGATIVE
Comment: NEGATIVE
Comment: NEGATIVE

## 2022-07-02 ENCOUNTER — Ambulatory Visit (INDEPENDENT_AMBULATORY_CARE_PROVIDER_SITE_OTHER): Payer: BC Managed Care – PPO | Admitting: Internal Medicine

## 2022-07-02 DIAGNOSIS — E538 Deficiency of other specified B group vitamins: Secondary | ICD-10-CM | POA: Diagnosis not present

## 2022-07-02 MED ORDER — CYANOCOBALAMIN 1000 MCG/ML IJ SOLN
1000.0000 ug | INTRAMUSCULAR | Status: AC
  Administered 2022-07-02 – 2022-11-04 (×4): 1000 ug via INTRAMUSCULAR

## 2022-08-03 ENCOUNTER — Ambulatory Visit (INDEPENDENT_AMBULATORY_CARE_PROVIDER_SITE_OTHER): Payer: BC Managed Care – PPO | Admitting: Internal Medicine

## 2022-08-03 DIAGNOSIS — E538 Deficiency of other specified B group vitamins: Secondary | ICD-10-CM

## 2022-09-01 ENCOUNTER — Ambulatory Visit (INDEPENDENT_AMBULATORY_CARE_PROVIDER_SITE_OTHER): Payer: BC Managed Care – PPO | Admitting: Internal Medicine

## 2022-09-01 DIAGNOSIS — E538 Deficiency of other specified B group vitamins: Secondary | ICD-10-CM

## 2022-09-01 DIAGNOSIS — K50819 Crohn's disease of both small and large intestine with unspecified complications: Secondary | ICD-10-CM

## 2022-09-02 ENCOUNTER — Ambulatory Visit: Payer: BC Managed Care – PPO

## 2022-10-08 ENCOUNTER — Ambulatory Visit: Payer: Self-pay

## 2022-11-04 ENCOUNTER — Telehealth: Payer: Self-pay | Admitting: Internal Medicine

## 2022-11-04 ENCOUNTER — Ambulatory Visit: Payer: Managed Care, Other (non HMO) | Admitting: Internal Medicine

## 2022-11-04 DIAGNOSIS — E538 Deficiency of other specified B group vitamins: Secondary | ICD-10-CM

## 2022-11-04 NOTE — Telephone Encounter (Signed)
Pt scheduled for Vit B12 Inj today at 2pm, pt aware of appt.

## 2022-11-04 NOTE — Telephone Encounter (Signed)
Inbound call from patient wishing to schedule a injection appointment for B12. Requesting a call back to have this scheduled. Please advise, thank you.

## 2022-12-07 ENCOUNTER — Ambulatory Visit: Payer: Managed Care, Other (non HMO)

## 2023-01-07 ENCOUNTER — Ambulatory Visit: Payer: Managed Care, Other (non HMO) | Admitting: Internal Medicine

## 2023-01-07 DIAGNOSIS — E538 Deficiency of other specified B group vitamins: Secondary | ICD-10-CM | POA: Diagnosis not present

## 2023-01-07 DIAGNOSIS — K5080 Crohn's disease of both small and large intestine without complications: Secondary | ICD-10-CM

## 2023-01-07 DIAGNOSIS — K50819 Crohn's disease of both small and large intestine with unspecified complications: Secondary | ICD-10-CM | POA: Diagnosis not present

## 2023-01-07 MED ORDER — CYANOCOBALAMIN 1000 MCG/ML IJ SOLN
1000.0000 ug | INTRAMUSCULAR | Status: AC
  Administered 2023-01-07 – 2023-02-09 (×2): 1000 ug via INTRAMUSCULAR

## 2023-02-09 ENCOUNTER — Ambulatory Visit: Payer: Managed Care, Other (non HMO) | Admitting: Internal Medicine

## 2023-02-09 DIAGNOSIS — K5080 Crohn's disease of both small and large intestine without complications: Secondary | ICD-10-CM | POA: Diagnosis not present

## 2023-02-09 DIAGNOSIS — E538 Deficiency of other specified B group vitamins: Secondary | ICD-10-CM | POA: Diagnosis not present

## 2023-03-07 ENCOUNTER — Other Ambulatory Visit: Payer: Self-pay | Admitting: Internal Medicine

## 2023-03-15 ENCOUNTER — Ambulatory Visit: Payer: Managed Care, Other (non HMO)

## 2023-06-30 ENCOUNTER — Other Ambulatory Visit: Payer: Self-pay | Admitting: Family Medicine

## 2023-06-30 DIAGNOSIS — R590 Localized enlarged lymph nodes: Secondary | ICD-10-CM

## 2023-07-02 ENCOUNTER — Ambulatory Visit
Admission: RE | Admit: 2023-07-02 | Discharge: 2023-07-02 | Disposition: A | Discharge status: Home / Self Care | Payer: Managed Care, Other (non HMO) | Source: Ambulatory Visit | Attending: Family Medicine | Admitting: Family Medicine

## 2023-07-02 ENCOUNTER — Other Ambulatory Visit: Payer: Managed Care, Other (non HMO)

## 2023-07-02 DIAGNOSIS — R590 Localized enlarged lymph nodes: Secondary | ICD-10-CM

## 2023-07-06 ENCOUNTER — Encounter: Payer: Self-pay | Admitting: Medical Oncology

## 2023-07-06 NOTE — Progress Notes (Signed)
 Rapid Diagnostic Clinic [facility] [phone]   Fax:([phone]  INITIAL CONSULTATION:  [name] Care Team: Shaune Pollack, MD (Inactive) as PCP - General (Family Medicine)  CHIEF COMPLAINTS/PURPOSE OF CONSULTATION:  Inguinal lymphadenopathy  HISTORY OF PRESENTING ILLNESS:  Brittany [name] 38 y.o. female presents to Brittany rapid diagnostic clinic for evaluation of inguinal lymphadenopathy.   On review of Brittany previous records, Brittany [name] was evaluated by Danae Chen, FNP on 06/29/2023 due to palpable lump noted in her right groin. She underwent pelvic US on 07/02/2023 which showed multiple lymph nodes within bilateral inguinal regions. Brittany largest node in Brittany right measures 0.9 cm.   On exam today, Brittany [name] reports that along with Brittany right inguinal node, she has felt small lymph nodes in Brittany back of her neck and axillary region. She reports Brittany lymph node size has changed over time getting bigger and then smaller. She is otherwise feeling well without changes to her energy or appetite. She exercises regularly and can complete her ADLs on her own. She denies nausea, vomiting or bowel habit changes. She denies easy bruising or signs of bleeding. She was told she had a fever of 101F with her PCP last week but no infection was identified. She denies any other infectious symptoms including chills, cough or nasal congestion. She has no other complaints. Rest of Brittany 10 point ROS is below.   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   Situational depression     SURGICAL HISTORY: Past Surgical History:  Procedure Laterality Date   HERNIA REPAIR     x 3 as a child, right inguinal and umbilical   TONSILLECTOMY       SOCIAL HISTORY: Social History   Socioeconomic History   Marital status: Single    Spouse name: Not on file   Number of children: 0   Years of education: Not on file   Highest education level: Not on file  Occupational History   Occupation: Systems developer   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   Drug use: Yes    Frequency: 7.0 times per week    Types: Marijuana    Comment: multiple times per day   Sexual activity: Yes    Birth control/protection: None  Other Topics Concern   Not on file  Social History Narrative   Drinks sweet tea , not qd   Social Drivers of Corporate investment banker Strain: Not on file  Food Insecurity: Not on file  Transportation Needs: Not on file  Physical Activity: Not on file  Stress: Not on file  Social Connections: Unknown (09/12/2021)   Received from [facility], [facility]   Social Network    Social Network: Not on file  Intimate Partner Violence: Unknown (08/11/2021)   Received from [facility], [facility]  HITS    Physically Hurt: Not on file    Insult or Talk Down To: Not on file    Threaten Physical Harm: Not on file    Scream or Curse: Not on file    FAMILY HISTORY: Family History  Problem Relation Age of Onset   Heart disease Mother    Diabetes Maternal Grandmother    Prostate cancer Maternal Grandfather    Diabetes Paternal Grandmother    Prostate cancer Paternal Grandfather    Hypertension Other        Both sides   Colon cancer Neg Hx     ALLERGIES:  is allergic to humira [adalimumab] and procaine hcl.  MEDICATIONS:  Current Outpatient Medications  Medication Sig Dispense Refill   Multiple Vitamin (MULTIVITAMIN) tablet Take 1 tablet by mouth  daily.     cyanocobalamin (VITAMIN B12) 1000 MCG/ML injection Inject 1 ml IM once monthly; NEEDS OFFICE VISIT FOR ADDITIONAL REFILLS. ([name] not taking: Reported on 07/07/2023) 1 mL 0   fluconazole (DIFLUCAN) 150 MG tablet Take 1 tablet (150 mg total) by mouth daily. Take 1 tablet by mouth once and may repeat in 3 days if symptoms persist ([name] not taking: Reported on 07/07/2023) 2 tablet 0   No current facility-administered medications for this visit.    REVIEW OF SYSTEMS:   Constitutional: ( - ) fevers, ( - )  chills , ( - ) night sweats Eyes: ( - ) blurriness of vision, ( - ) double vision, ( - ) watery eyes Ears, nose, mouth, throat, and face: ( - ) mucositis, ( - ) sore throat Respiratory: ( - ) cough, ( - ) dyspnea, ( - ) wheezes Cardiovascular: ( - ) palpitation, ( - ) chest discomfort, ( - ) lower extremity swelling Gastrointestinal:  ( - ) nausea, ( - ) heartburn, ( - ) change in bowel habits Skin: ( - ) abnormal skin rashes Lymphatics: ( +) new lymphadenopathy, ( - ) easy bruising Neurological: ( - ) numbness, ( - ) tingling, ( - ) new weaknesses Behavioral/Psych: ( - ) mood change, ( - ) new changes  All other systems were reviewed with Brittany [name] and are negative.  PHYSICAL EXAMINATION: ECOG PERFORMANCE STATUS: 1 - Symptomatic but completely ambulatory  Vitals:   07/07/23 0905  BP: 132/77  Pulse: 99  Resp: 16  Temp: 98.1 F (36.7 C)  SpO2: 100%   Filed Weights   07/07/23 0905  Weight: 106 lb 14.4 oz (48.5 kg)    GENERAL: well appearing female in NAD  SKIN: skin color, texture, turgor are normal, no rashes or significant lesions EYES: conjunctiva are pink and non-injected, sclera clear OROPHARYNX: no exudate, no erythema; lips, buccal mucosa, and tongue normal  NECK: supple, non-tender LYMPH:  small palpable lymph node <1 cm noted in right inguinal region, left posterior cervical region.  LUNGS: clear to auscultation and percussion with normal breathing  effort HEART: regular rate & rhythm and no murmurs and no lower extremity edema ABDOMEN: soft, non-tender, non-distended, normal bowel sounds. No hepatosplenomegaly.  Musculoskeletal: no cyanosis of digits and no clubbing  PSYCH: alert & oriented x 3, fluent speech NEURO: no focal motor/sensory deficits  LABORATORY DATA:  I have reviewed Brittany data as listed    Latest Ref Rng & Units 07/07/2023   10:04 AM 05/05/2022    3:49 PM 09/02/2020    3:45 PM  CBC  WBC 4.0 - 10.5 K/uL 7.2  5.7  5.4   Hemoglobin 12.0 -  15.0 g/dL 08.6  57.8  46.9   Hematocrit 36.0 - 46.0 % 44.1  39.0  39.0   Platelets 150 - 400 K/uL 244  226.0  201.0        Latest Ref Rng & Units 07/07/2023   10:04 AM 05/05/2022    3:49 PM 11/28/2013    3:47 PM  CMP  Glucose 70 - 99 mg/dL 95  88  80   BUN 6 - 20 mg/dL 8  7  10    Creatinine 0.44 - 1.00 mg/dL 6.29  5.28  0.6   Sodium 135 - 145 mmol/L 138  146  138   Potassium 3.5 - 5.1 mmol/L 4.1  3.8  4.5   Chloride 98 - 111 mmol/L 108  110  104   CO2 22 - 32 mmol/L 25  29  28    Calcium 8.9 - 10.3 mg/dL 9.3  9.3  41.3   Total Protein 6.5 - 8.1 g/dL 7.7  6.5  7.8   Total Bilirubin 0.0 - 1.2 mg/dL 0.4  0.4  1.0   Alkaline Phos 38 - 126 U/L 57  47  70   AST 15 - 41 U/L 18  17  17    ALT 0 - 44 U/L 12  9  12       RADIOGRAPHIC STUDIES: I have personally reviewed Brittany radiological images as listed and agreed with Brittany findings in Brittany report. US PELVIS LIMITED (TRANSABDOMINAL ONLY) Result Date: 07/02/2023 : PROCEDURE: US PELVIS LIMITED HISTORY: [name] is a 38 y/o F with bilateral inguinal lymphadenopathy. COMPARISON: TECHNIQUE: Two-dimensional grayscale and color Doppler ultrasound of Brittany bilateral inguinal regions was performed. FINDINGS: Multiple lymph nodes are identified within Brittany bilateral inguinal regions, some of these lack a fatty hilum. Brittany largest node on Brittany right measures 0.9 cm in short axis dimension and demonstrates a heterogeneous appearance without fatty hilum. Brittany  largest node on Brittany left measures 0.4 cm in short axis dimension. IMPRESSION: 1. Multiple lymph nodes identified within Brittany bilateral inguinal regions. Thank you for allowing [place] to assist in Brittany care of this [name]. Electronically Signed   By: Lestine Box M.D.   On: 07/02/2023 20:51    ASSESSMENT & PLAN Brittany [name] is a 38 y.o. female who presents to [facility]any rapid diagnostic clinic for evaluation of inguinal lymphadenopathy.   #Bilateral inguinal lymphadenopathy: --I reviewed possible etiologies including infectious process, inflammatory process or underlying malignancy.  --Pelvic ultrasound from 07/02/2023 showed subcentimeter bilateral inguinal lymphadenopathy. I explained this is below Brittany threshold to warrant a biopsy.  --[name] will proceed with laboratory evaluation first to check CBC, CMP, LDH, flow cytometry, ESR, CRP and ANA levels. --Based on laboratory results, we will determine if additional imaging is required to evaluate additional sites of lymphadenopathy. Otherwise, we will monitor and have [name] return in 3 months to closely monitor palpable lymph nodes.  --RTC once workup is complete.   Orders Placed This Encounter  Procedures   CBC with Differential (Cancer Center Only)    Standing Status:   Future    Number of Occurrences:   1    Expiration Date:   07/05/2024   CMP (Cancer Center only)    Standing Status:   Future    Number of Occurrences:   1    Expiration Date:   07/05/2024   Lactate dehydrogenase (LDH)    Standing Status:   Future    Number of Occurrences:   1    Expiration Date:   07/05/2024  Sedimentation rate    Standing Status:   Future    Number of Occurrences:   1    Expiration Date:   07/05/2024   C-reactive protein    Standing Status:   Future    Number of Occurrences:   1    Expiration Date:   07/05/2024   Flow Cytometry, Peripheral Blood (Oncology)    Standing Status:   Future    Number of Occurrences:   1    Expiration Date:   07/05/2024   ANA,  IFA (with reflex)    Standing Status:   Future    Number of Occurrences:   1    Expiration Date:   07/06/2024    All questions were answered. Brittany [name] knows to call Brittany clinic with any problems, questions or concerns.  I have spent a total of 60 minutes minutes of face-to-face and non-face-to-face time, preparing to see Brittany [name], obtaining and/or reviewing separately obtained history, performing a medically appropriate examination, counseling and educating Brittany [name], ordering medications/tests/procedures, documenting clinical information in Brittany electronic health record, independently interpreting results and communicating results to Brittany [name], and care coordination.   Georga Kaufmann, PA-C Department of Hematology/Oncology [facility] at [facility] Phone: [phone]  [name] was seen with Dr. Leonides Schanz.   I have read Brittany above note and personally examined Brittany [name]. I agree with Brittany assessment and plan as noted above.  Briefly Brittany [name] is a 38 year old female who presents for evaluation of inguinal lymphadenopathy.  Based on pelvic ultrasound on 07/02/2023 Brittany [name] was noted to have prominent lymph nodes, though none of them are larger than 1 cm.  Brittany [name] does not have any underlying inflammatory disorders or inflammatory symptoms.  We will order inflammatory markers to include ESR, CRP as well as a flow cytometry.  Will order CBC, CMP, and LDH.  She is also had no recent infections.  Given these findings recommend pursuing a CT chest abdomen pelvis as well as CT scan of Brittany neck to evaluate palpable lymph nodes in Brittany neck.  Pending Brittany results of Brittany scans we will determine if lymph node biopsy is required.  [name] voiced understanding of our findings and plan moving forward.   Ulysees Barns, MD Department of Hematology/Oncology [facility] at [facility] Phone: [phone] Pager: [phone] Email:  [email or web address]

## 2023-07-07 ENCOUNTER — Inpatient Hospital Stay: Payer: Managed Care, Other (non HMO)

## 2023-07-07 ENCOUNTER — Encounter: Payer: Self-pay | Admitting: Physician Assistant

## 2023-07-07 ENCOUNTER — Encounter: Payer: Self-pay | Admitting: Medical Oncology

## 2023-07-07 ENCOUNTER — Inpatient Hospital Stay: Payer: Managed Care, Other (non HMO) | Attending: Physician Assistant | Admitting: Physician Assistant

## 2023-07-07 VITALS — BP 132/77 | HR 99 | Temp 98.1°F | Resp 16 | Ht 61.0 in | Wt 106.9 lb

## 2023-07-07 DIAGNOSIS — R591 Generalized enlarged lymph nodes: Secondary | ICD-10-CM

## 2023-07-07 DIAGNOSIS — F17210 Nicotine dependence, cigarettes, uncomplicated: Secondary | ICD-10-CM | POA: Insufficient documentation

## 2023-07-07 DIAGNOSIS — R590 Localized enlarged lymph nodes: Secondary | ICD-10-CM | POA: Diagnosis present

## 2023-07-07 DIAGNOSIS — R599 Enlarged lymph nodes, unspecified: Secondary | ICD-10-CM

## 2023-07-07 LAB — CMP (CANCER CENTER ONLY)
ALT: 12 U/L (ref 0–44)
AST: 18 U/L (ref 15–41)
Albumin: 4.4 g/dL (ref 3.5–5.0)
Alkaline Phosphatase: 57 U/L (ref 38–126)
Anion gap: 5 (ref 5–15)
BUN: 8 mg/dL (ref 6–20)
CO2: 25 mmol/L (ref 22–32)
Calcium: 9.3 mg/dL (ref 8.9–10.3)
Chloride: 108 mmol/L (ref 98–111)
Creatinine: 0.68 mg/dL (ref 0.44–1.00)
GFR, Estimated: >60 mL/min (ref >60)
Glucose, Bld: 95 mg/dL (ref 70–99)
Potassium: 4.1 mmol/L (ref 3.5–5.1)
Sodium: 138 mmol/L (ref 135–145)
Total Bilirubin: 0.4 mg/dL (ref 0.0–1.2)
Total Protein: 7.7 g/dL (ref 6.5–8.1)

## 2023-07-07 LAB — LACTATE DEHYDROGENASE: LDH: 135 U/L (ref 98–192)

## 2023-07-07 LAB — CBC WITH DIFFERENTIAL (CANCER CENTER ONLY)
Abs Immature Granulocytes: 0.02 10*3/uL (ref 0.00–0.07)
Basophils Absolute: 0 10*3/uL (ref 0.0–0.1)
Basophils Relative: 0 %
Eosinophils Absolute: 0.2 10*3/uL (ref 0.0–0.5)
Eosinophils Relative: 3 %
HCT: 44.1 % (ref 36.0–46.0)
Hemoglobin: 14.9 g/dL (ref 12.0–15.0)
Immature Granulocytes: 0 %
Lymphocytes Relative: 23 %
Lymphs Abs: 1.7 10*3/uL (ref 0.7–4.0)
MCH: 31.9 pg (ref 26.0–34.0)
MCHC: 33.8 g/dL (ref 30.0–36.0)
MCV: 94.4 fL (ref 80.0–100.0)
Monocytes Absolute: 0.5 10*3/uL (ref 0.1–1.0)
Monocytes Relative: 7 %
Neutro Abs: 4.8 10*3/uL (ref 1.7–7.7)
Neutrophils Relative %: 67 %
Platelet Count: 244 10*3/uL (ref 150–400)
RBC: 4.67 MIL/uL (ref 3.87–5.11)
RDW: 13.2 % (ref 11.5–15.5)
WBC Count: 7.2 10*3/uL (ref 4.0–10.5)
nRBC: 0 % (ref 0.0–0.2)

## 2023-07-07 LAB — C-REACTIVE PROTEIN: CRP: 0.5 mg/dL (ref <1.0)

## 2023-07-07 LAB — SEDIMENTATION RATE: Sed Rate: 16 mm/h (ref 0–22)

## 2023-07-07 NOTE — Progress Notes (Signed)
 Rapid Diagnostic Clinic  Patient in this morning for her scheduled appointment with Georga Kaufmann, PA-C. I met with patient and introduced myself. Patient here with family. Patient was provided with my contact information and encouraged to call me with questions/concerns.  Rexene Edison, RN, BSN, [facility] Oncology Nurse Navigator, Rapid Diagnostic Clinic 07/07/2023 9:23 AM

## 2023-07-07 NOTE — Patient Instructions (Signed)
 Diagnostic Clinic Office Visit Discharge Information and Instructions  Thank you for choosing [facility] for your healthcare needs.  Below is a summary of today's discussion, along with our contact information and an outline of what to expect next.  Reason for Visit:  Inguinal lymphadenopathy  Proposed Diagnostic Care Plan: Laboratory evaluation today +/- additional imaging and biopsy.   What to Expect: - Generally, when lab tests are ordered the results can take up to 1 week for results to be available.  At that point, we will contact you to discuss your results with you.  Unless there is a critical result, we will typically wait for all of your lab results to be available before contacting you. - If a biopsy is part of your Care Plan, those results can take on average 7-10 days to result.  Once results are available, we will contact you to discuss your pathology results and any next steps. - If you have additional imaging ordered, such as a CT Scan, MRI, Ultrasound, Bone Scan, or PET scan, your imaging will need to be authorized then scheduled with the earliest available appointment.  You may be asked to travel to another hospital within [facility] who has a sooner availability, please consider doing so if asked. - If you use MyChart, your results will be available to you in the MyChart portal.  Your provider will be in touch with you as soon as all of your results are available to be discussed.  Your Diagnostic Clinic Provider:  Georga Kaufmann PA-C and Dr. Jeanie Sewer, contact number [phone] Your Diagnostic Navigator:  Chauncy Lean, RN, contact number [phone]  If you or your caregiver have number blocking on your cell phones, please ensure the cancer center's numbers are not blocked.  If you are not a registered MyChart user, please consider enrolling in MyChart to receive your test results and visit notes.  You can also access your discharge instructions electronically.   MyChart also gives you an electronic means to communicate with your Care Team instead of needing to call in to the cancer center.  We appreciate you trusting [place] with your healthcare and look forward to partnering with you as we work to uncover what your potential diagnosis may be.  Please do not hesitate to reach out at any point with questions or concerns. '

## 2023-07-08 ENCOUNTER — Other Ambulatory Visit: Payer: Self-pay | Admitting: Physician Assistant

## 2023-07-08 ENCOUNTER — Encounter: Payer: Self-pay | Admitting: Medical Oncology

## 2023-07-08 LAB — SURGICAL PATHOLOGY

## 2023-07-08 NOTE — Progress Notes (Signed)
 Rapid Diagnostic Clinic  Call to patient and spoke with her regarding scheduling imaging appointments. Patient scheduled for March 4th, Tuesday to arrive at 4:30 PM to drink contrast (two hours prior to imaging) for 6:30 PM. Patient encouraged to call to me with questions/concerns. Call back number provided.   Rexene Edison, RN, BSN, [facility] Oncology Nurse Navigator, Rapid Diagnostic Clinic 07/08/2023 4:52 PM

## 2023-07-08 NOTE — Progress Notes (Signed)
 I called [name] to review the labs from 07/07/2023. Results were unremarkable and we are waiting for ANA levels to finalize. Dr. Leonides Le recommends baseline CT imaging to further assess lymph nodes that are palpable on physical exam. [name] is agreeable to move forward with CT neck along with CT CAP. We will follow up by phone once results are available.

## 2023-07-09 ENCOUNTER — Encounter: Payer: Self-pay | Admitting: Medical Oncology

## 2023-07-09 ENCOUNTER — Other Ambulatory Visit: Payer: Self-pay

## 2023-07-09 DIAGNOSIS — R599 Enlarged lymph nodes, unspecified: Secondary | ICD-10-CM

## 2023-07-09 DIAGNOSIS — R591 Generalized enlarged lymph nodes: Secondary | ICD-10-CM

## 2023-07-09 NOTE — Progress Notes (Signed)
 Rapid Diagnostic Clinic  Call with patient regarding cancelled imaging. Patient informed me that her insurance would only cover scans with [facility] imaging and had to cancel her appts here at [facility]. Patient states that rescheduling is in process with [facility]. Patient thanked and was asked to call me with questions/concerns.  Rexene Edison, RN, BSN, [facility] Oncology Nurse Navigator, Rapid Diagnostic Clinic 07/09/2023 3:25 PM

## 2023-07-12 LAB — FLOW CYTOMETRY

## 2023-07-13 ENCOUNTER — Ambulatory Visit (HOSPITAL_COMMUNITY): Payer: Managed Care, Other (non HMO)

## 2023-07-14 LAB — ANTINUCLEAR ANTIBODIES, IFA: ANA Ab, IFA: NEGATIVE

## 2023-07-16 ENCOUNTER — Encounter: Payer: Self-pay | Admitting: Medical Oncology

## 2023-07-16 ENCOUNTER — Other Ambulatory Visit: Payer: Self-pay | Admitting: Physician Assistant

## 2023-07-16 DIAGNOSIS — R591 Generalized enlarged lymph nodes: Secondary | ICD-10-CM

## 2023-07-16 DIAGNOSIS — R599 Enlarged lymph nodes, unspecified: Secondary | ICD-10-CM

## 2023-07-16 NOTE — Progress Notes (Signed)
 Rapid Diagnostic Clinic  Spoke with patient and informed her that do to her insurance not authorizing CT of Chest/Abdomen/Pelvis her appt was changed to only having CT of Abdomen and Pelvis. Patient states that she already had picked up the contrast to drink this morning. Patient gave her verbal understanding. Patient thanked and encouraged to call me any questions or concerns.   Gregary Cromer, RN, BSN, [facility] Oncology Nurse Navigator, Rapid Diagnostic Clinic 07/16/2023 12:05 PM

## 2023-07-19 ENCOUNTER — Ambulatory Visit
Admission: RE | Admit: 2023-07-19 | Discharge: 2023-07-19 | Disposition: A | Discharge status: Home / Self Care | Source: Ambulatory Visit | Attending: Physician Assistant

## 2023-07-19 ENCOUNTER — Ambulatory Visit
Admission: RE | Admit: 2023-07-19 | Discharge: 2023-07-19 | Disposition: A | Discharge status: Home / Self Care | Payer: Managed Care, Other (non HMO) | Source: Ambulatory Visit | Attending: Physician Assistant | Admitting: Physician Assistant

## 2023-07-19 ENCOUNTER — Other Ambulatory Visit: Payer: Managed Care, Other (non HMO)

## 2023-07-19 DIAGNOSIS — R591 Generalized enlarged lymph nodes: Secondary | ICD-10-CM

## 2023-07-19 DIAGNOSIS — R599 Enlarged lymph nodes, unspecified: Secondary | ICD-10-CM

## 2023-07-19 MED ORDER — IOPAMIDOL (ISOVUE-300) INJECTION 61%
100.0000 mL | Freq: Once | INTRAVENOUS | Status: AC | PRN
  Administered 2023-07-19: 100 mL via INTRAVENOUS

## 2023-07-22 ENCOUNTER — Telehealth: Payer: Self-pay | Admitting: Physician Assistant

## 2023-07-22 DIAGNOSIS — J439 Emphysema, unspecified: Secondary | ICD-10-CM

## 2023-07-22 DIAGNOSIS — E041 Nontoxic single thyroid nodule: Secondary | ICD-10-CM

## 2023-07-22 DIAGNOSIS — R591 Generalized enlarged lymph nodes: Secondary | ICD-10-CM

## 2023-07-22 DIAGNOSIS — R599 Enlarged lymph nodes, unspecified: Secondary | ICD-10-CM

## 2023-07-22 NOTE — Telephone Encounter (Signed)
 I called [name] to review the CT neck and CT abdomen/pelvis results. CT neck showed no adenopathy but incidental findings included thyromegaly with nodules that require thyroid US for further evaluation. In addition, there is evidence of emphysema. Patient is a current smoker so I will make referral to pulmonology.   The CT abdomen/pelvis showed small inguinal lymphadenopathy with the biggest measuring 10 mm. Dr. Leonides Le recommends to monitor for now and repeat imaging in 3 months. Additionally, there is evidence of bilateral adnexal cystic lesions that is currently monitored by her OB/GYN. We will forward the CT scan results to her OB/GYN team.   [name] expressed understanding of the plan provided.

## 2023-07-26 ENCOUNTER — Encounter: Payer: Self-pay | Admitting: Medical Oncology

## 2023-07-26 NOTE — Progress Notes (Signed)
 Rapid Diagnostic Clinic  Outgoing call  Call to patient this morning to inform her of scheduled Ultra Sound on March 24th at 9 AM. Patient was informed to arrive at 0845, here at [facility], for her appointment. Patient gave verbal understanding and denied questions. Patient knows to expect call from me once I have her follow-up appt with Dr. Leonides Schanz and CT to be scheduled in 3,months.   Patient was also informed that clinic notes and imaging results were faxed to the referring PCP, Dr. Chanetta Marshall as well as her OB/GYN Dr. Cherly Hensen.   Patient thanked for her time and encouraged to call me with questions/concerns.   Gregary Cromer, RN, BSN, [facility] Oncology Nurse Navigator, Rapid Diagnostic Clinic 07/26/2023 10:13 AM

## 2023-08-02 ENCOUNTER — Other Ambulatory Visit: Payer: Self-pay | Admitting: Physician Assistant

## 2023-08-02 ENCOUNTER — Ambulatory Visit (HOSPITAL_COMMUNITY)
Admission: RE | Admit: 2023-08-02 | Discharge: 2023-08-02 | Disposition: A | Discharge status: Home / Self Care | Source: Ambulatory Visit | Attending: Physician Assistant | Admitting: Physician Assistant

## 2023-08-02 DIAGNOSIS — E041 Nontoxic single thyroid nodule: Secondary | ICD-10-CM | POA: Diagnosis present

## 2023-08-11 ENCOUNTER — Telehealth: Payer: Self-pay | Admitting: Physician Assistant

## 2023-08-11 ENCOUNTER — Encounter: Payer: Self-pay | Admitting: Medical Oncology

## 2023-08-11 NOTE — Telephone Encounter (Signed)
 I spoke to Brittany Le to review the thyroid US results from 08/02/2022 that shows nodule measuring 1.3 cm meeting TI-RADS 4 criteria. No biopsy is required but recommend annual [place] surveillance for next 5 years to ensure stability.   We will request PCP to arrange for surveillance ultrasounds moving forward.   Brittany Le expressed understanding of the plan provided.

## 2023-08-11 NOTE — Progress Notes (Signed)
 Rapid Diagnostic Clinic  Per PA-C, Rolland Porter request, [place] of thyroid faxed to patient's PCP, Dr. Chanetta Marshall, and informed that US showed a nodule that requires annual [place] surveillance for 5 years. Office to contact me with questions.  Gregary Cromer, RN, BSN, [facility] Oncology Nurse Navigator, Rapid Diagnostic Clinic 08/11/2023 3:55 PM

## 2023-09-16 ENCOUNTER — Telehealth: Payer: Self-pay | Admitting: *Deleted

## 2023-09-16 ENCOUNTER — Other Ambulatory Visit: Payer: Self-pay | Admitting: *Deleted

## 2023-09-16 DIAGNOSIS — R599 Enlarged lymph nodes, unspecified: Secondary | ICD-10-CM

## 2023-09-16 DIAGNOSIS — R591 Generalized enlarged lymph nodes: Secondary | ICD-10-CM

## 2023-09-16 NOTE — Telephone Encounter (Signed)
 TCT patient regarding her upcoming CT scan. Spoke with her. Advised that her CT scan of abdomen and pelvis has been scheduled for 09/30/23. She states she will be on vacation the. Provided phone # to [facility] on [address] so she can call to re-schedule. Pt voiced understanding.

## 2023-09-22 ENCOUNTER — Encounter: Payer: Self-pay | Admitting: Physician Assistant

## 2023-09-30 ENCOUNTER — Other Ambulatory Visit

## 2023-10-06 ENCOUNTER — Ambulatory Visit: Admitting: Pulmonary Disease

## 2023-10-08 ENCOUNTER — Ambulatory Visit
Admission: RE | Admit: 2023-10-08 | Discharge: 2023-10-08 | Disposition: A | Discharge status: Home / Self Care | Source: Ambulatory Visit | Attending: Physician Assistant | Admitting: Physician Assistant

## 2023-10-08 DIAGNOSIS — R599 Enlarged lymph nodes, unspecified: Secondary | ICD-10-CM

## 2023-10-08 DIAGNOSIS — R591 Generalized enlarged lymph nodes: Secondary | ICD-10-CM

## 2023-10-08 MED ORDER — IOPAMIDOL (ISOVUE-370) INJECTION 76%
70.0000 mL | Freq: Once | INTRAVENOUS | Status: AC | PRN
Start: 2023-10-08 — End: 2023-10-08
  Administered 2023-10-08: 70 mL via INTRAVENOUS

## 2023-10-28 ENCOUNTER — Inpatient Hospital Stay (HOSPITAL_BASED_OUTPATIENT_CLINIC_OR_DEPARTMENT_OTHER): Admitting: Hematology and Oncology

## 2023-10-28 ENCOUNTER — Inpatient Hospital Stay: Attending: Hematology and Oncology

## 2023-10-28 ENCOUNTER — Other Ambulatory Visit: Payer: Self-pay | Admitting: Hematology and Oncology

## 2023-10-28 VITALS — BP 128/88 | HR 71 | Temp 98.2°F | Resp 17 | Ht 61.0 in | Wt 108.0 lb

## 2023-10-28 DIAGNOSIS — R599 Enlarged lymph nodes, unspecified: Secondary | ICD-10-CM | POA: Diagnosis not present

## 2023-10-28 DIAGNOSIS — R635 Abnormal weight gain: Secondary | ICD-10-CM | POA: Insufficient documentation

## 2023-10-28 DIAGNOSIS — F17210 Nicotine dependence, cigarettes, uncomplicated: Secondary | ICD-10-CM | POA: Insufficient documentation

## 2023-10-28 DIAGNOSIS — E041 Nontoxic single thyroid nodule: Secondary | ICD-10-CM | POA: Diagnosis not present

## 2023-10-28 DIAGNOSIS — R591 Generalized enlarged lymph nodes: Secondary | ICD-10-CM

## 2023-10-28 DIAGNOSIS — R590 Localized enlarged lymph nodes: Secondary | ICD-10-CM | POA: Insufficient documentation

## 2023-10-28 LAB — CBC WITH DIFFERENTIAL (CANCER CENTER ONLY)
Abs Immature Granulocytes: 0.02 10*3/uL (ref 0.00–0.07)
Basophils Absolute: 0 10*3/uL (ref 0.0–0.1)
Basophils Relative: 1 %
Eosinophils Absolute: 0.4 10*3/uL (ref 0.0–0.5)
Eosinophils Relative: 6 %
HCT: 41.1 % (ref 36.0–46.0)
Hemoglobin: 13.9 g/dL (ref 12.0–15.0)
Immature Granulocytes: 0 %
Lymphocytes Relative: 31 %
Lymphs Abs: 2 10*3/uL (ref 0.7–4.0)
MCH: 31.1 pg (ref 26.0–34.0)
MCHC: 33.8 g/dL (ref 30.0–36.0)
MCV: 91.9 fL (ref 80.0–100.0)
Monocytes Absolute: 0.5 10*3/uL (ref 0.1–1.0)
Monocytes Relative: 7 %
Neutro Abs: 3.7 10*3/uL (ref 1.7–7.7)
Neutrophils Relative %: 55 %
Platelet Count: 230 10*3/uL (ref 150–400)
RBC: 4.47 MIL/uL (ref 3.87–5.11)
RDW: 12.9 % (ref 11.5–15.5)
WBC Count: 6.6 10*3/uL (ref 4.0–10.5)
nRBC: 0 % (ref 0.0–0.2)

## 2023-10-28 LAB — CMP (CANCER CENTER ONLY)
ALT: 10 U/L (ref 0–44)
AST: 17 U/L (ref 15–41)
Albumin: 3.9 g/dL (ref 3.5–5.0)
Alkaline Phosphatase: 52 U/L (ref 38–126)
Anion gap: 6 (ref 5–15)
BUN: 8 mg/dL (ref 6–20)
CO2: 25 mmol/L (ref 22–32)
Calcium: 8.9 mg/dL (ref 8.9–10.3)
Chloride: 108 mmol/L (ref 98–111)
Creatinine: 0.73 mg/dL (ref 0.44–1.00)
GFR, Estimated: >60 mL/min (ref >60)
Glucose, Bld: 88 mg/dL (ref 70–99)
Potassium: 3.7 mmol/L (ref 3.5–5.1)
Sodium: 139 mmol/L (ref 135–145)
Total Bilirubin: 0.5 mg/dL (ref 0.0–1.2)
Total Protein: 6.6 g/dL (ref 6.5–8.1)

## 2023-10-28 LAB — LACTATE DEHYDROGENASE: LDH: 107 U/L (ref 98–192)

## 2023-10-28 NOTE — Progress Notes (Signed)
 [facility] Telephone:([phone]   [phone]  PROGRESS NOTE  Patient Care Team: Ransom Byers, MD as PCP - General (Family Medicine) Ivery Marking, MD as PCP - OBGYN (Obstetrics and Gynecology) Annella Barrows, RN as Oncology Nurse Navigator (Medical Oncology)  Hematological/Oncological History # Inguinal lymphadenopathy   Interval History:  Brittany Le 38 y.o. female with medical history significant for inguinal lymph nodes who presents for a follow up visit. The patient's last visit was on 07/07/2023. In the interim since the last visit she had a repeat scan on 10/08/2023 which showed benign lymphadenopathy and stable bilateral adnexal cysts.   On exam today Brittany Le reports she has been well overall in the interim since our last visit earlier this year.  She reports that she has been gaining weight and is up 4 pounds.  Her energy levels are strong.  She is not having any lumps or bumps concerning for recurrent lymphadenopathy.  She denies any fevers, chills, sweats.  She notes that she is not having any swallowing difficulty and overall is in great spirits.  She has not started any new medications or had any other changes in her health in the interim since her last visit.  A full 10 point ROS is otherwise negative.  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   Situational depression     SURGICAL HISTORY: Past Surgical History:  Procedure Laterality Date   HERNIA REPAIR     x 3 as a child, right inguinal and umbilical   TONSILLECTOMY      SOCIAL HISTORY: Social History   Socioeconomic History   Marital status: Single    Spouse name: Not on file   Number of children: 0   Years of  education: Not on file   Highest education level: Not on file  Occupational History   Occupation: Systems developer   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   Drug use: Yes    Frequency: 7.0 times per week    Types: Marijuana    Comment: multiple times per day   Sexual activity: Yes    Birth control/protection: None  Other Topics Concern   Not on file  Social History Narrative   Drinks sweet tea , not qd   Social Drivers of Corporate investment banker Strain: Not on file  Food Insecurity: Not on file  Transportation Needs: Not on file  Physical Activity: Not on file  Stress: Not on file  Social Connections: Unknown (09/12/2021)   Received from [facility]   Social Network    Social Network: Not on file  Intimate Partner Violence: Unknown (08/11/2021)   Received from [facility]   HITS    Physically Hurt: Not on file    Insult or Talk Down To: Not on file    Threaten  Physical Harm: Not on file    Scream or Curse: Not on file    FAMILY HISTORY: Family History  Problem Relation Age of Onset   Heart disease Mother    Diabetes Maternal Grandmother    Prostate cancer Maternal Grandfather    Diabetes Paternal Grandmother    Prostate cancer Paternal Grandfather    Hypertension Other        Both sides   Colon cancer Neg Hx     ALLERGIES:  is allergic to humira  [adalimumab ] and procaine hcl.  MEDICATIONS:  Current Outpatient Medications  Medication Sig Dispense Refill   cyanocobalamin  (VITAMIN B12) 1000 MCG/ML injection Inject 1 ml IM once monthly; NEEDS OFFICE VISIT FOR ADDITIONAL REFILLS. (Patient not taking: Reported on 07/07/2023) 1 mL 0   fluconazole  (DIFLUCAN ) 150 MG tablet Take 1 tablet (150 mg total) by  mouth daily. Take 1 tablet by mouth once and may repeat in 3 days if symptoms persist (Patient not taking: Reported on 07/07/2023) 2 tablet 0   Multiple Vitamin (MULTIVITAMIN) tablet Take 1 tablet by mouth daily.     No current facility-administered medications for this visit.    REVIEW OF SYSTEMS:   Constitutional: ( - ) fevers, ( - )  chills , ( - ) night sweats Eyes: ( - ) blurriness of vision, ( - ) double vision, ( - ) watery eyes Ears, nose, mouth, throat, and face: ( - ) mucositis, ( - ) sore throat Respiratory: ( - ) cough, ( - ) dyspnea, ( - ) wheezes Cardiovascular: ( - ) palpitation, ( - ) chest discomfort, ( - ) lower extremity swelling Gastrointestinal:  ( - ) nausea, ( - ) heartburn, ( - ) change in bowel habits Skin: ( - ) abnormal skin rashes Lymphatics: ( - ) new lymphadenopathy, ( - ) easy bruising Neurological: ( - ) numbness, ( - ) tingling, ( - ) new weaknesses Behavioral/Psych: ( - ) mood change, ( - ) new changes  All other systems were reviewed with the patient and are negative.  PHYSICAL EXAMINATION: Vitals:   10/28/23 1039  BP: 128/88  Pulse: 71  Resp: 17  Temp: 98.2 F (36.8 C)  SpO2: 100%   Filed Weights   10/28/23 1039  Weight: 108 lb (49 kg)    GENERAL: Well-appearing middle-age African-American female, alert, no distress and comfortable SKIN: skin color, texture, turgor are normal, no rashes or significant lesions EYES: conjunctiva are pink and non-injected, sclera clear LUNGS: clear to auscultation and percussion with normal breathing effort HEART: regular rate & rhythm and no murmurs and no lower extremity edema Musculoskeletal: no cyanosis of digits and no clubbing  PSYCH: alert & oriented x 3, fluent speech NEURO: no focal motor/sensory deficits  LABORATORY DATA:  I have reviewed the data as listed    Latest Ref Rng & Units 10/28/2023   10:25 AM 07/07/2023   10:04 AM 05/05/2022    3:49 PM  CBC  WBC 4.0 - 10.5 K/uL 6.6  7.2  5.7    Hemoglobin 12.0 - 15.0 g/dL 06.2  37.6  28.3   Hematocrit 36.0 - 46.0 % 41.1  44.1  39.0   Platelets 150 - 400 K/uL 230  244  226.0        Latest Ref Rng & Units 07/07/2023   10:04 AM 05/05/2022    3:49 PM 11/28/2013    3:47 PM  CMP  Glucose 70 - 99 mg/dL 95  88  80  BUN 6 - 20 mg/dL 8  7  10    Creatinine 0.44 - 1.00 mg/dL 1.61  0.96  0.6   Sodium 135 - 145 mmol/L 138  146  138   Potassium 3.5 - 5.1 mmol/L 4.1  3.8  4.5   Chloride 98 - 111 mmol/L 108  110  104   CO2 22 - 32 mmol/L 25  29  28    Calcium 8.9 - 10.3 mg/dL 9.3  9.3  04.5   Total Protein 6.5 - 8.1 g/dL 7.7  6.5  7.8   Total Bilirubin 0.0 - 1.2 mg/dL 0.4  0.4  1.0   Alkaline Phos 38 - 126 U/L 57  47  70   AST 15 - 41 U/L 18  17  17    ALT 0 - 44 U/L 12  9  12     RADIOGRAPHIC STUDIES: CT ABDOMEN PELVIS W CONTRAST Result Date: 10/08/2023 TITLE: CT ABDOMEN PELVIS WITH IV CONTRAST COMPARISON: 07/19/2023 CLINICAL HISTORY: lymphadenopathy TECHNIQUE: CT abdomen pelvis with contrast. Automated exposure control was utilized. This exam was performed according to our departmental dose-optimization program which includes automated exposure control, adjustment of the mA and/or kV according to patient size and/or use of iterative reconstruction technique. FINDINGS: Scout is unremarkable. Lung bases are clear. The liver, gallbladder, pancreas, spleen, adrenal glands and kidneys are normal. There is no bowel obstruction, free air or free fluid. Stable bilateral adnexal cysts likely benign functional ovarian cyst. Largest measures 4.2 cm on today's examination. Uterus is grossly unremarkable. There is no retroperitoneal or mesenteric adenopathy. The abdominal aorta is nonaneurysmal. Stable benign lymph nodes in the inguinal regions. IMPRESSION: Stable bilateral adnexal cysts are likely benign functional ovarian cyst. Otherwise unremarkable CT of the abdomen and pelvis. Stable benign lymph nodes in the inguinal regions. Electronically signed by:  Adrien Alberta MD 10/08/2023 03:58 PM EDT RP Workstation: [id number]    ASSESSMENT & PLAN Iliani Vejar 38 y.o. female with medical history significant for inguinal lymph nodes who presents for a follow up visit.   #Bilateral inguinal lymphadenopathy -- Lymph nodes have not been enlarged over time and appears stable.  All of these lymph nodes are subcentimeter. -- CT imaging on 10/08/2023 showed stable lymph nodes, no evidence of progression. -- No indication for biopsy at this time. -- Labs today show white blood cell 6.6, hemoglobin 13.9, MCV 91.9, platelets 230 -- Return to clinic on an as-needed basis.  # Thyroid  Nodule -- Yearly US  imaging recommended x 5 years.   --Thyroid  nodule to be followed by patient's primary care provider.  No orders of the defined types were placed in this encounter.   All questions were answered. The patient knows to call the clinic with any problems, questions or concerns.  A total of more than 25 minutes were spent on this encounter with face-to-face time and non-face-to-face time, including preparing to see the patient, ordering tests and/or medications, counseling the patient and coordination of care as outlined above.   Rogerio Clay, MD Department of Hematology/Oncology [facility] Phone: [phone] Pager: [phone] Email: [email or web address]  10/28/2023 11:00 AM

## 2023-12-27 ENCOUNTER — Ambulatory Visit: Admitting: Pulmonary Disease

## 2024-02-14 ENCOUNTER — Encounter: Payer: Self-pay | Admitting: Pulmonary Disease

## 2024-02-14 ENCOUNTER — Ambulatory Visit: Admitting: Pulmonary Disease

## 2024-02-14 VITALS — BP 135/88 | HR 100 | Temp 98.0°F | Ht 62.0 in | Wt 104.6 lb

## 2024-02-14 DIAGNOSIS — F1290 Cannabis use, unspecified, uncomplicated: Secondary | ICD-10-CM | POA: Diagnosis not present

## 2024-02-14 DIAGNOSIS — F17210 Nicotine dependence, cigarettes, uncomplicated: Secondary | ICD-10-CM

## 2024-02-14 DIAGNOSIS — J438 Other emphysema: Secondary | ICD-10-CM | POA: Diagnosis not present

## 2024-02-14 DIAGNOSIS — Z716 Tobacco abuse counseling: Secondary | ICD-10-CM

## 2024-02-14 NOTE — Progress Notes (Signed)
 @Patient  ID: Brittany Le, female    DOB: 1985-12-04, 38 y.o.   MRN: [id number]  Chief Complaint  Patient presents with   Consult    Pulmonary Emphysema     Referring provider: Neomi Johnston ONEIDA Le  HPI:   38 y.o. woman whom we are seeing for evaluation of emphysema.  Multiple notes from hematology/oncology clinic reviewed.  Patient presented to PCP some months ago.  With some swelling in the neck.  Not swelling overall but discrete areas of swelling.  Also had similar sensation in her groin.  Felt to be lymph nodes.  Sent for a CT scan of abdomen pelvis that shows clear lungs and the lower lung fields on my review and interpretation.  Sent for CT scan of the neck which reveals mild to moderate paraseptal emphysematous changes in the upper lung fields with decreasing severity caudally on my review and interpretation.  This prompted referral.  Subsequent CT scan of the abdomen pelvis 09/2023 with similar findings as above on my review and interpretation.  She has history of Crohn's disease.  Over a decade ago she had a bad flare.  Was prescribed Humira  and unfortunately had a bad allergic reaction.  She found marijuana to be very beneficial in terms of treating her pain, inflammation.  She continued smoking this.  She also admits to smoking occasional black and mild cigarillos.  Since the discovery of the emphysema on the CT of her neck 07/2023, she has cut back on both.  Smoking less black and milds.  Smoking less marijuana.  Much less frequently.  She has tried some other home remedies for inflammation etc. and she feels improved on these.  Questionaires / Pulmonary Flowsheets:   ACT:      No data to display          MMRC:     No data to display          Epworth:      No data to display          Tests:   FENO:  No results found for: NITRICOXIDE  PFT:     No data to display          WALK:      No data to display          Imaging: Personally  reviewed and as per EMR and discussion of this note No results found.  Lab Results: Personally reviewed CBC    Component Value Date/Time   WBC 6.6 10/28/2023 1025   WBC 5.7 05/05/2022 1549   RBC 4.47 10/28/2023 1025   HGB 13.9 10/28/2023 1025   HCT 41.1 10/28/2023 1025   PLT 230 10/28/2023 1025   MCV 91.9 10/28/2023 1025   MCH 31.1 10/28/2023 1025   MCHC 33.8 10/28/2023 1025   RDW 12.9 10/28/2023 1025   LYMPHSABS 2.0 10/28/2023 1025   MONOABS 0.5 10/28/2023 1025   EOSABS 0.4 10/28/2023 1025   BASOSABS 0.0 10/28/2023 1025    BMET    Component Value Date/Time   NA 139 10/28/2023 1025   K 3.7 10/28/2023 1025   CL 108 10/28/2023 1025   CO2 25 10/28/2023 1025   GLUCOSE 88 10/28/2023 1025   BUN 8 10/28/2023 1025   CREATININE 0.73 10/28/2023 1025   CALCIUM 8.9 10/28/2023 1025   GFRNONAA >60 10/28/2023 1025    BNP No results found for: BNP  ProBNP No results found for: PROBNP  Specialty Problems   None   Allergies  Allergen Reactions   Humira  [Adalimumab ] Other (See Comments)    Pt had Plantar/Palmar Pustular Psoriasis reaction per dermatologist.   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   cyanocobalamin  (VITAMIN B12) 1000 MCG/ML injection Inject 1 ml IM once monthly; NEEDS OFFICE VISIT FOR ADDITIONAL REFILLS. (Patient not taking: Reported on 02/14/2024)   fluconazole  (DIFLUCAN ) 150 MG tablet Take 1 tablet (150 mg total) by mouth daily. Take 1 tablet by mouth once and may repeat in 3 days if symptoms persist (Patient not taking: Reported on 02/14/2024)   No facility-administered encounter medications on file as of 02/14/2024.     Review of Systems  Review of Systems  No chest pain with exertion.  No orthopnea or PND.  Comprehensive review systems otherwise negative. Physical Exam  BP 135/88   Pulse 100   Temp 98 F (36.7 C) (Temporal)   Ht 5' 2 (1.575 m)   Wt 104 lb 9.6 oz (47.4 kg)   SpO2 99%   BMI 19.13 kg/m   Wt Readings from Last 5 Encounters:  02/14/24 104 lb 9.6 oz (47.4 kg)  10/28/23 108 lb (49 kg)  07/07/23 106 lb 14.4 oz (48.5 kg)  05/05/22 110 lb (49.9 kg)  06/06/19 95 lb (43.1 kg)    BMI Readings from Last 5 Encounters:  02/14/24 19.13 kg/m  10/28/23 20.41 kg/m  07/07/23 20.20 kg/m  05/21/22 20.12 kg/m  05/05/22 20.12 kg/m     Physical Exam General: Sitting in chair, no acute distress Eyes: EOMI, no icterus Neck: Supple, no JVP Pulmonary: Clear, distant Cardiovascular: Warm, no edema Abdomen: Nondistended MSK: No synovitis, no joint effusion Neuro: Normal gait, no weakness Psych: Normal mood, full affect   Assessment & Plan:   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  She agrees and is eager to cut back.  Already cutting back quite a bit on both.  We discussed expectation that once no further insult, smoking, that no further damage we done to the lungs.  She expressed understanding.  Tobacco abuse: Discussed smoking cessation.  Gradual reduction in particular Black and milds.   As well as marijuana reduction, consideration of edibles.  I spent 3 minutes of smoking cessation counseling.   Return if symptoms worsen or fail to improve.   Brittany JONELLE Beals, MD 02/14/2024

## 2024-03-11 ENCOUNTER — Ambulatory Visit
Admission: EM | Admit: 2024-03-11 | Discharge: 2024-03-11 | Disposition: A | Discharge status: Home / Self Care | Attending: Family Medicine | Admitting: Family Medicine

## 2024-03-11 DIAGNOSIS — N898 Other specified noninflammatory disorders of vagina: Secondary | ICD-10-CM | POA: Insufficient documentation

## 2024-03-11 LAB — POCT URINE DIPSTICK
Bilirubin, UA: NEGATIVE
Glucose, UA: NEGATIVE mg/dL
Ketones, POC UA: NEGATIVE mg/dL
Leukocytes, UA: NEGATIVE
Nitrite, UA: NEGATIVE
POC PROTEIN,UA: NEGATIVE
Spec Grav, UA: 1.025 (ref 1.010–1.025)
Urobilinogen, UA: 0.2 U/dL
pH, UA: 5.5 (ref 5.0–8.0)

## 2024-03-11 LAB — POCT URINE PREGNANCY: Preg Test, Ur: NEGATIVE

## 2024-03-11 MED ORDER — METRONIDAZOLE 500 MG PO TABS: 500.0000 mg | ORAL_TABLET | Freq: Two times a day (BID) | ORAL | 0 refills | Status: AC

## 2024-03-11 MED ORDER — FLUCONAZOLE 150 MG PO TABS: 150.0000 mg | ORAL_TABLET | Freq: Every day | ORAL | 0 refills | Status: AC

## 2024-03-11 NOTE — ED Triage Notes (Signed)
 Pt states that she has vaginal itching and vaginal odor. X3-4 days

## 2024-03-11 NOTE — Discharge Instructions (Addendum)
 The clinic will contact you with results of the testing done today if positive.  Start Diflucan  as prescribed and metronidazole  twice daily for 7 days.  Please follow-up with your PCP or gynecologist if your symptoms do not improve.  Please go to the ER for any worsening symptoms.  Hope you feel better soon!

## 2024-03-11 NOTE — ED Provider Notes (Signed)
 [facility]    CSN: [id number] Arrival date & time: 03/11/24  1030      History   Chief Complaint Chief Complaint  Patient presents with   Vaginal Itching    HPI Brittany Le is a 38 y.o. female presents for evaluation of vaginal itching/odor.  Patient reports 1 week of vaginal itching/odor.  Denies any dysuria, fevers, vomiting or flank pain.  No known STD exposure or concern.  Does have a history of both BV and yeast and states these comes feel similar to both of those.  Has not taken any OTC treatments for symptoms.  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      Home Medications    Prior to Admission medications   Medication Sig Start Date End Date Taking? Authorizing Provider  fluconazole  (DIFLUCAN ) 150 MG tablet Take 1 tablet (150 mg total) by mouth daily for 2 doses. Take 1 tablet today and you may repeat in 3 days if symptoms persist 03/11/24 03/13/24 Yes Anne-Marie Genson, Jodi R, NP  metroNIDAZOLE  (FLAGYL ) 500 MG tablet Take 1 tablet (500 mg total) by mouth 2 (two) times daily. 03/11/24  Yes Masayo Fera, Jodi R, NP  Multiple Vitamin (MULTIVITAMIN) tablet Take 1 tablet by mouth daily.   Yes [provider]    Family History Family History  Problem Relation Age of Onset   Heart disease Mother    Diabetes Maternal Grandmother     Prostate cancer Maternal Grandfather    Diabetes Paternal Grandmother    Prostate cancer Paternal Grandfather    Hypertension Other        Both sides   Colon cancer Neg Hx     Social History Social History   Tobacco Use   Smoking status: Every Day    Types: Cigarettes, Cigars   Smokeless tobacco: Never   Tobacco comments:    3-5 cigars/day  Vaping Use   Vaping status: Never Used  Substance Use Topics   Alcohol use: Yes    Comment: occ.   Drug use: Yes    Frequency: 7.0 times per week    Types: Marijuana    Comment: multiple times per day     Allergies   Humira  [adalimumab ] and Procaine hcl   Review of Systems Review of Systems  Genitourinary:  Positive for vaginal discharge.     Physical Exam Triage Vital Signs ED Triage Vitals  Encounter Vitals Group     BP 03/11/24 1038 (!) 143/101     Girls Systolic BP Percentile --      Girls Diastolic BP Percentile --      Boys Systolic BP Percentile --      Boys Diastolic BP Percentile --  Pulse Rate 03/11/24 1038 (!) 106     Resp 03/11/24 1038 19     Temp 03/11/24 1038 99.3 F (37.4 C)     Temp Source 03/11/24 1038 Oral     SpO2 03/11/24 1038 97 %     Weight 03/11/24 1036 103 lb (46.7 kg)     Height 03/11/24 1036 5' 2 (1.575 m)     Head Circumference --      Peak Flow --      Pain Score 03/11/24 1036 0     Pain Loc --      Pain Education --      Exclude from Growth Chart --    No data found.  Updated Vital Signs BP (!) 143/101 (BP Location: Right Arm)   Pulse (!) 106   Temp 99.3 F (37.4 C) (Oral)   Resp 19   Ht 5' 2 (1.575 m)   Wt 103 lb (46.7 kg)   LMP 02/13/2024   SpO2 97%   BMI 18.84 kg/m   Visual Acuity Right Eye Distance:   Left Eye Distance:   Bilateral Distance:    Right Eye Near:   Left Eye Near:    Bilateral Near:     Physical Exam Vitals and nursing note reviewed.  Constitutional:      Appearance: Normal appearance.  HENT:     Head: Normocephalic and atraumatic.   Eyes:     Pupils: Pupils are equal, round, and reactive to light.  Cardiovascular:     Rate and Rhythm: Normal rate.  Pulmonary:     Effort: Pulmonary effort is normal.  Skin:    General: Skin is warm and dry.  Neurological:     General: No focal deficit present.     Mental Status: She is alert and oriented to person, place, and time.  Psychiatric:        Mood and Affect: Mood normal.        Behavior: Behavior normal.      UC Treatments / Results  Labs (all labs ordered are listed, but only abnormal results are displayed) Labs Reviewed  POCT URINE DIPSTICK - Abnormal; Notable for the following components:      Result Value   Blood, UA small (*)    All other components within normal limits  POCT URINE PREGNANCY  CERVICOVAGINAL ANCILLARY ONLY   CMP (Cancer Center only) Order: [id number]  Status: Final result     Next appt: None     Dx: Enlarged lymph nodes   Test Result Released: Yes (not seen)   0 Result Notes          Component Ref Range & Units (hover) 4 mo ago (10/28/23) 8 mo ago (07/07/23) 1 yr ago (05/05/22) 10 yr ago (11/28/13) 11 yr ago (05/13/12) 12 yr ago (06/12/11) 12 yr ago (04/27/11)  Sodium 139 138 146 High  R 138 R 139 R 140 R 142 R  Potassium 3.7 4.1 3.8 R 4.5 R 4.7 R 3.4 Low  R 4.2 R  Chloride 108 108 110 R 104 R 107 R 107 R 107 R  CO2 25 25 29  R 28 R 25 R 27 R 23 R  Glucose, Bld 88 95 CM 88 80 84 77 92  Comment: Glucose reference range applies only to samples taken after fasting for at least 8 hours.  BUN 8 8 7  R 10 R 8 R 6 R 8 R  Creatinine 0.73 0.68 0.82 R 0.6 R 0.7 R 0.6  R 0.8 R  Calcium 8.9 9.3 9.3 R 10.1 R 9.7 R 9.3 R 9.7 R  Total Protein 6.6 7.7 6.5 R 7.8 R 7.7 R 7.2 R 8.5 High  R  Albumin 3.9 4.4 3.9 R 4.8 R 4.0 R 3.8 R 3.9 R  AST 17 18 17  R 17 R 16 R 13 R 14 R  ALT 10 12 9  R 12 R 12 R 11 R 8 R  Alkaline Phosphatase 52 57 47 R 70 R 57 R 55 R 69 R  Total Bilirubin 0.5 0.4 0.4 R 1.0 R 0.7 R 0.6 R 0.6 R  GFR, Estimated >60 >60 CM        Comment: (NOTE) Calculated using the CKD-EPI Creatinine Equation (2021)  Anion gap 6 5 CM       Comment: Performed at [facility], [address] [place], [place] [address]  Resulting Agency [facility] [place] [facility] [place] [facility] [place] [facility] [place] [facility]        Specimen Collected: 10/28/23 10:25 Last Resulted: 10/28/23 11:11    EKG   Radiology No results found.  Procedures Procedures (including critical care time)  Medications Ordered in UC Medications - No data to display  Initial Impression / Assessment and Plan / UC Course  I have reviewed the triage vital signs and the nursing notes.  Pertinent labs & imaging results that were available during my care of the patient were reviewed by me and considered in my medical decision making (see chart for details).     Reviewed exam and symptoms with patient.  Vaginal swab is ordered and will contact for any positive results.  Will treat for BV and yeast with metronidazole  and Diflucan .  PCP or GYN follow-up if symptoms do not improve.  ER precautions reviewed Final Clinical Impressions(s) / UC Diagnoses   Final diagnoses:  Vaginal itching     Discharge Instructions      The clinic will contact you with results of the testing done today if positive.  Start Diflucan  as prescribed and metronidazole  twice daily for 7 days.  Please follow-up with your PCP or gynecologist if your symptoms do not improve.  Please go to the ER for any worsening symptoms.  Hope you feel better soon!    ED Prescriptions     Medication Sig Dispense Auth. Provider   fluconazole  (DIFLUCAN ) 150 MG tablet Take 1 tablet (150 mg total) by mouth daily for 2 doses. Take 1 tablet today and you may repeat in 3 days if symptoms persist 2 tablet Diamante Rubin, Jodi R, NP   metroNIDAZOLE  (FLAGYL ) 500 MG tablet Take 1 tablet (500 mg total) by mouth 2 (two) times daily. 14 tablet Kawehi Hostetter, Jodi R, NP       PDMP not reviewed this encounter.   Loreda Myla SAUNDERS, NP 03/11/24 1057

## 2024-03-13 ENCOUNTER — Ambulatory Visit (HOSPITAL_COMMUNITY): Payer: Self-pay

## 2024-03-13 LAB — CERVICOVAGINAL ANCILLARY ONLY
Bacterial Vaginitis (gardnerella): POSITIVE — AB
Candida Glabrata: NEGATIVE
Candida Vaginitis: NEGATIVE
Comment: NEGATIVE
Comment: NEGATIVE
Comment: NEGATIVE

## 2024-03-29 ENCOUNTER — Encounter: Payer: Self-pay | Admitting: Oncology
